# Patient Record
Sex: Female | Born: 1965 | Race: White | Hispanic: No | Marital: Single | Smoking: Former smoker
Health system: Southern US, Community
[De-identification: ages and names within clinical notes are randomized; demographics above are authoritative.]

## PROBLEM LIST (undated history)

## (undated) DIAGNOSIS — E119 Type 2 diabetes mellitus without complications: Secondary | ICD-10-CM

## (undated) DIAGNOSIS — Z794 Long term (current) use of insulin: Secondary | ICD-10-CM

## (undated) DIAGNOSIS — Z1239 Encounter for other screening for malignant neoplasm of breast: Secondary | ICD-10-CM

## (undated) DIAGNOSIS — I10 Essential (primary) hypertension: Secondary | ICD-10-CM

## (undated) DIAGNOSIS — M1990 Unspecified osteoarthritis, unspecified site: Secondary | ICD-10-CM

## (undated) DIAGNOSIS — F32A Depression, unspecified: Secondary | ICD-10-CM

## (undated) DIAGNOSIS — Z6829 Body mass index (BMI) 29.0-29.9, adult: Secondary | ICD-10-CM

## (undated) DIAGNOSIS — S069XAA Unspecified intracranial injury with loss of consciousness status unknown, initial encounter: Secondary | ICD-10-CM

## (undated) DIAGNOSIS — T794XXA Traumatic shock, initial encounter: Secondary | ICD-10-CM

## (undated) DIAGNOSIS — E785 Hyperlipidemia, unspecified: Secondary | ICD-10-CM

## (undated) DIAGNOSIS — D6800 Von Willebrand disease, unspecified: Secondary | ICD-10-CM

## (undated) DIAGNOSIS — D692 Other nonthrombocytopenic purpura: Secondary | ICD-10-CM

## (undated) DIAGNOSIS — K219 Gastro-esophageal reflux disease without esophagitis: Secondary | ICD-10-CM

## (undated) DIAGNOSIS — R0789 Other chest pain: Secondary | ICD-10-CM

## (undated) HISTORY — PX: WISDOM TOOTH EXTRACTION: SHX21

## (undated) HISTORY — DX: Body mass index (BMI) 29.0-29.9, adult: Z68.29

## (undated) HISTORY — PX: TRACHEOSTOMY TUBE PLACEMENT: SHX814

## (undated) HISTORY — DX: Depression, unspecified: F32.A

## (undated) HISTORY — PX: OTHER SURGICAL HISTORY: SHX169

## (undated) HISTORY — DX: Unspecified intracranial injury with loss of consciousness status unknown, initial encounter: S06.9XAA

## (undated) HISTORY — PX: TRACHEOSTOMY CLOSURE: SHX458

## (undated) HISTORY — DX: Other nonthrombocytopenic purpura: D69.2

## (undated) HISTORY — DX: Traumatic shock, initial encounter: T79.4XXA

## (undated) HISTORY — DX: Other chest pain: R07.89

## (undated) HISTORY — DX: Von Willebrand disease, unspecified: D68.00

## (undated) HISTORY — PX: BRAIN SURGERY: SHX531

## (undated) HISTORY — DX: Hyperlipidemia, unspecified: E78.5

## (undated) HISTORY — DX: Essential (primary) hypertension: I10

---

## 2004-08-17 ENCOUNTER — Emergency Department (HOSPITAL_COMMUNITY): Admission: EM | Admit: 2004-08-17 | Discharge: 2004-08-17 | Payer: Self-pay | Admitting: Emergency Medicine

## 2004-12-01 ENCOUNTER — Emergency Department (HOSPITAL_COMMUNITY): Admission: EM | Admit: 2004-12-01 | Discharge: 2004-12-01 | Payer: Self-pay | Admitting: Emergency Medicine

## 2005-01-02 ENCOUNTER — Emergency Department (HOSPITAL_COMMUNITY): Admission: EM | Admit: 2005-01-02 | Discharge: 2005-01-02 | Payer: Self-pay | Admitting: Emergency Medicine

## 2005-01-06 ENCOUNTER — Inpatient Hospital Stay (HOSPITAL_COMMUNITY): Admission: AC | Admit: 2005-01-06 | Discharge: 2005-01-13 | Payer: Self-pay

## 2005-01-06 DIAGNOSIS — S069XAA Unspecified intracranial injury with loss of consciousness status unknown, initial encounter: Secondary | ICD-10-CM

## 2005-01-06 HISTORY — PX: BRAIN SURGERY: SHX531

## 2005-01-06 HISTORY — DX: Unspecified intracranial injury with loss of consciousness status unknown, initial encounter: S06.9XAA

## 2005-01-06 HISTORY — PX: TRACHEOSTOMY TUBE PLACEMENT: SHX814

## 2005-01-13 ENCOUNTER — Ambulatory Visit: Payer: Self-pay | Admitting: Physical Medicine & Rehabilitation

## 2005-01-13 ENCOUNTER — Inpatient Hospital Stay (HOSPITAL_COMMUNITY)
Admission: RE | Admit: 2005-01-13 | Discharge: 2005-02-02 | Payer: Self-pay | Admitting: Physical Medicine & Rehabilitation

## 2005-03-02 ENCOUNTER — Ambulatory Visit: Payer: Self-pay | Admitting: Physical Medicine & Rehabilitation

## 2005-03-02 ENCOUNTER — Encounter
Admission: RE | Admit: 2005-03-02 | Discharge: 2005-05-31 | Payer: Self-pay | Admitting: Physical Medicine & Rehabilitation

## 2005-08-14 ENCOUNTER — Ambulatory Visit: Payer: Self-pay | Admitting: Physical Medicine & Rehabilitation

## 2005-08-14 ENCOUNTER — Encounter
Admission: RE | Admit: 2005-08-14 | Discharge: 2005-11-12 | Payer: Self-pay | Admitting: Physical Medicine & Rehabilitation

## 2005-10-09 ENCOUNTER — Ambulatory Visit: Payer: Self-pay | Admitting: Physical Medicine & Rehabilitation

## 2005-12-04 ENCOUNTER — Ambulatory Visit: Payer: Self-pay | Admitting: Physical Medicine & Rehabilitation

## 2005-12-04 ENCOUNTER — Encounter
Admission: RE | Admit: 2005-12-04 | Discharge: 2006-03-04 | Payer: Self-pay | Admitting: Physical Medicine & Rehabilitation

## 2006-06-24 IMAGING — CR DG CHEST 1V PORT
1 series · 1 of 1 positions shown · non-contrast
Comparison: Chest xray from earlier today

CLINICAL DATA: PICC line placement, multiple trauma
 PORTABLE CHEST ? ONE VIEW:  01/06/05  3378 hours

[view not recorded]
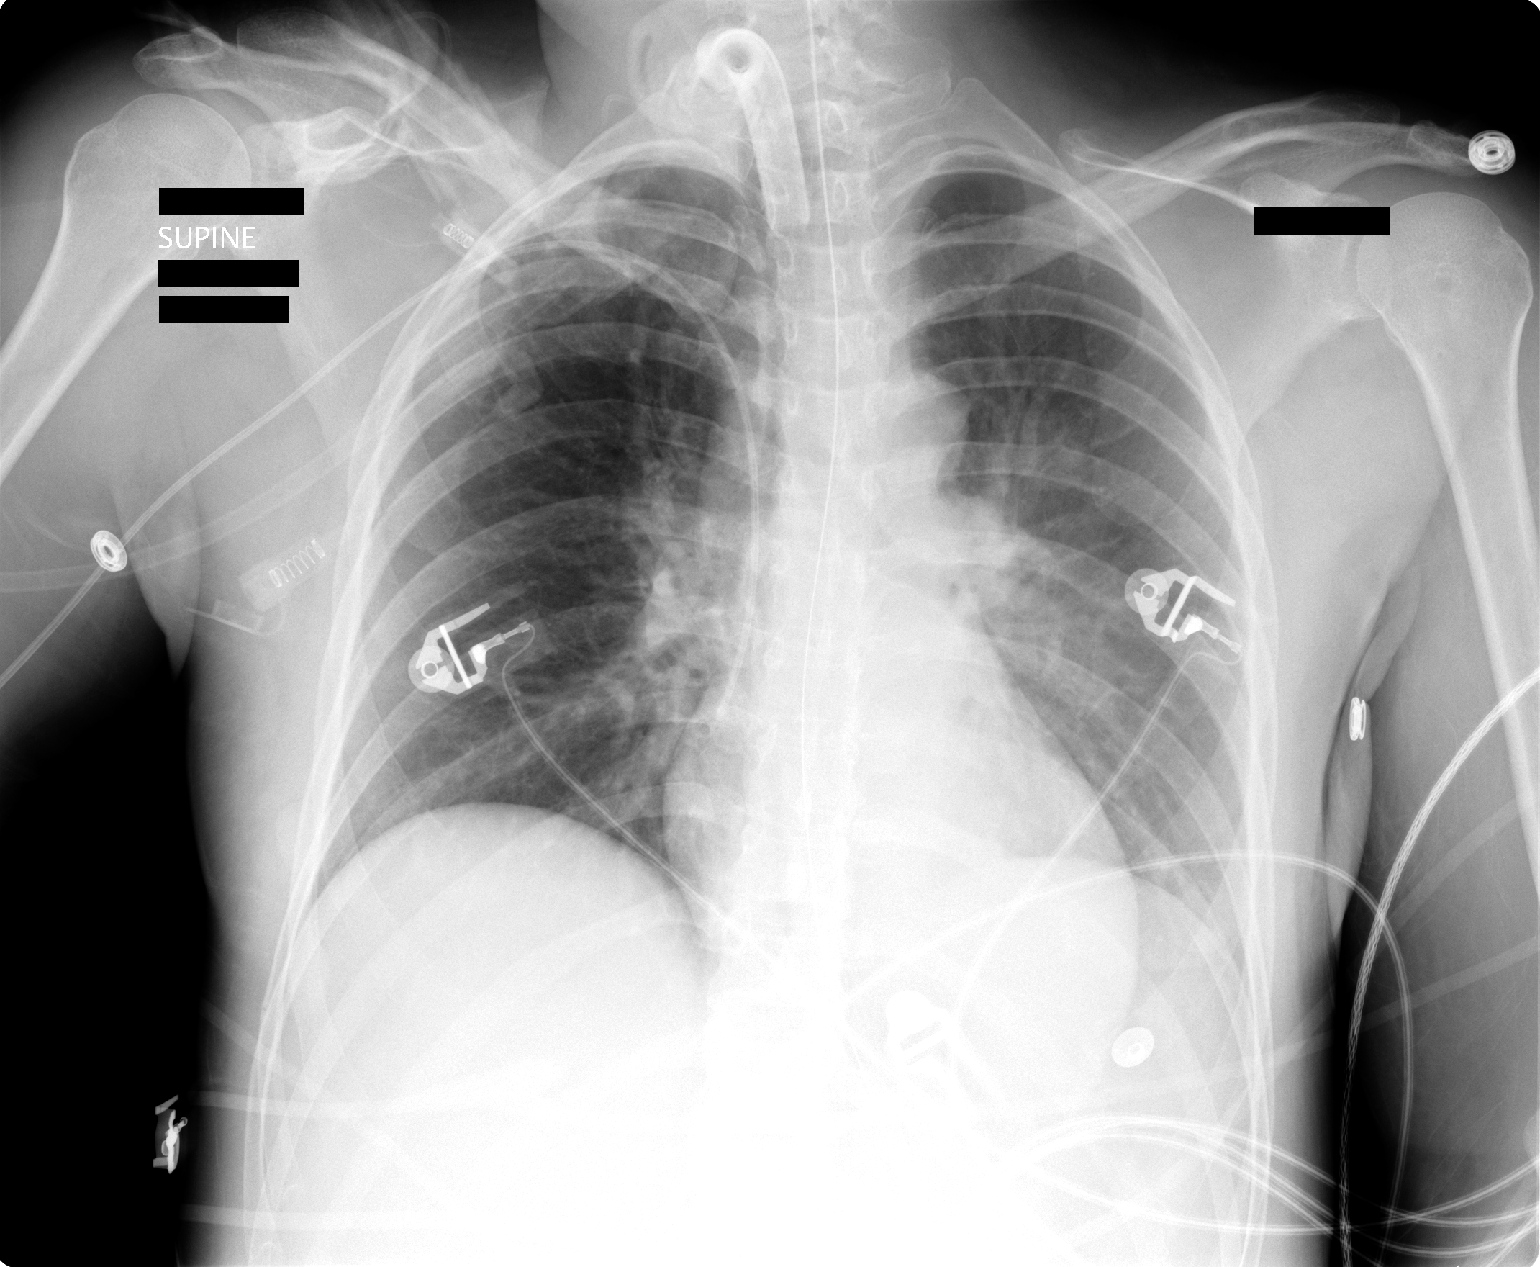

[1 of 1 positions shown; findings below may reference images not displayed]

FINDINGS: A right PICC line has been inserted with the tip in the low SVC.  No pneumothorax is seen.  Tracheostomy and nasogastric tube remain.  Mild left basilar atelectasis is present.
IMPRESSION: Right PICC tip in low SVC.  No pneumothorax.  Mild left basilar atelectasis.

## 2006-06-25 IMAGING — CT CT HEAD W/O CM
1 of 2 series · 13 of 30 positions shown, 17 images · non-contrast
Comparison: 01/06/2005

CLINICAL DATA: Trauma, bleed

HEAD CT WITHOUT CONTRAST
TECHNIQUE: 5mm collimated images were obtained from the base of the skull
through the vertex according to standard protocol without contrast.

[Series 2: brain · axial · 0.47mm/px · z∈[+113,+234]mm · 13 of 28 slices shown, 17 images]
[im 2/28  brain]
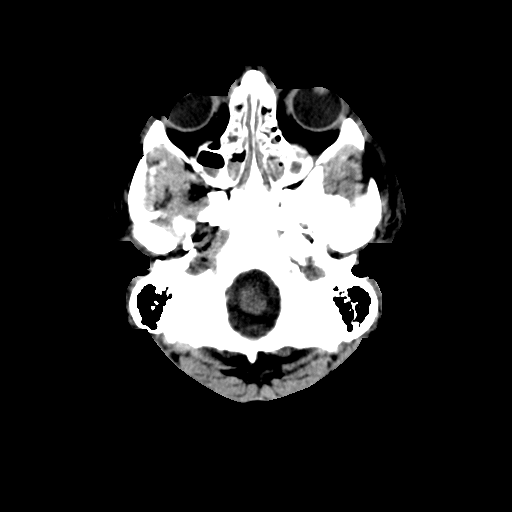
[im 2/28  bone]
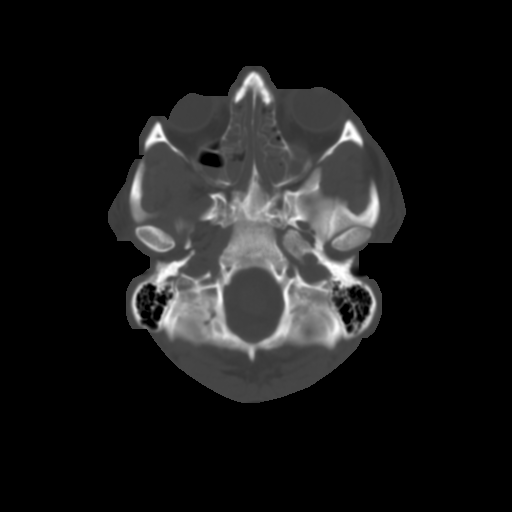
[im 4/28  brain]
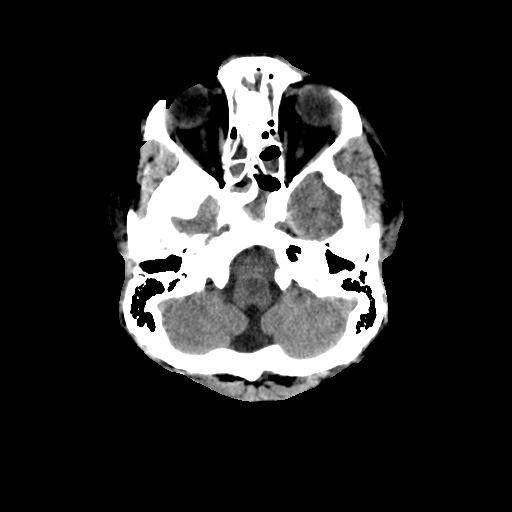
[im 6/28  brain]
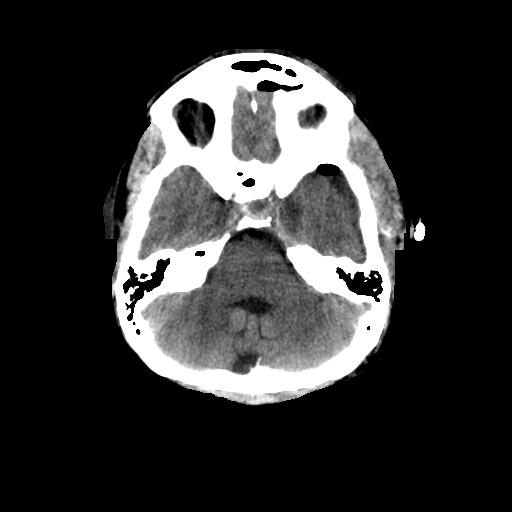
[im 8/28  brain]
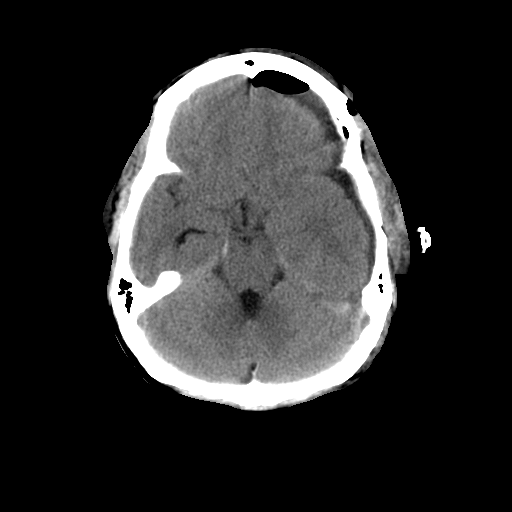
[im 10/28  brain]
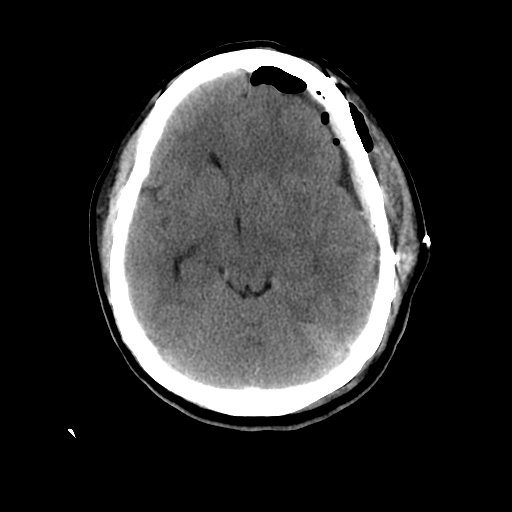
[im 10/28  bone]
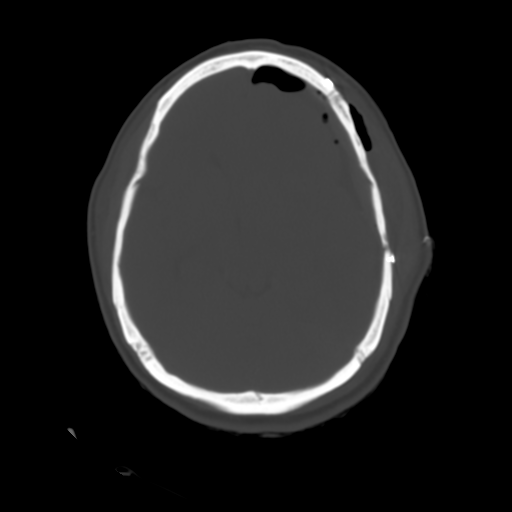
[im 12/28  brain]
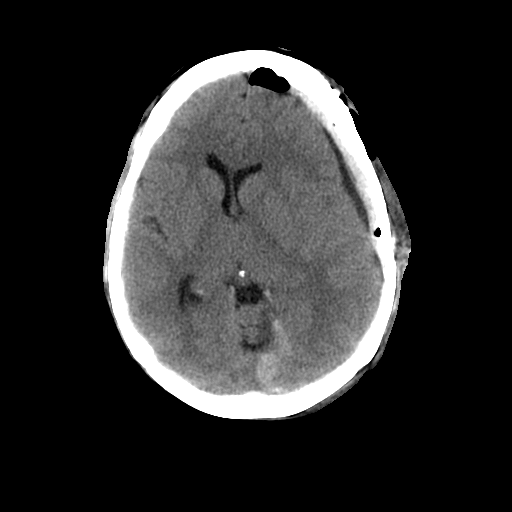
[im 14/28  brain]
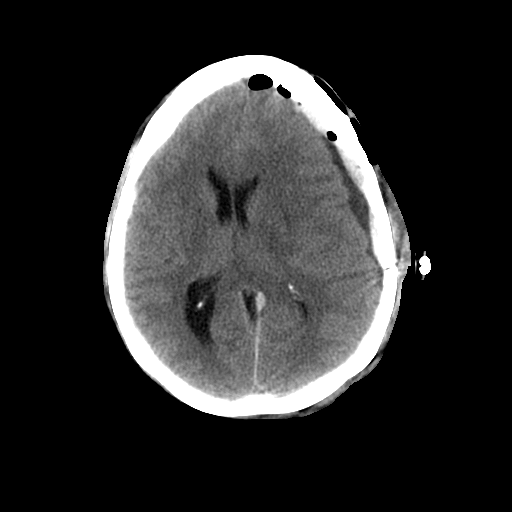
[im 16/28  brain]
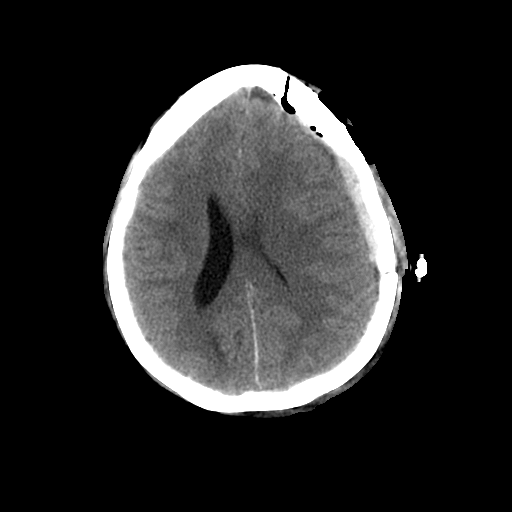
[im 18/28  brain]
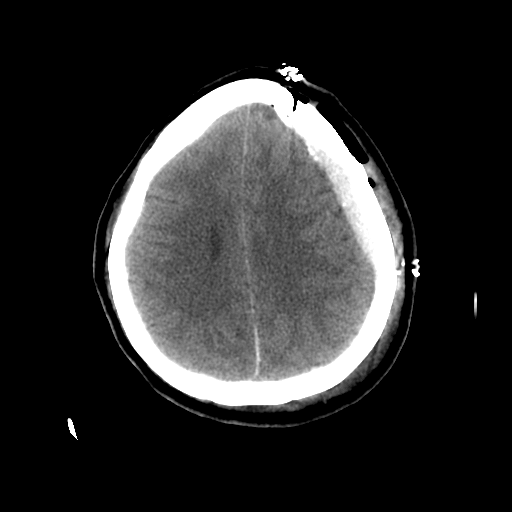
[im 18/28  bone]
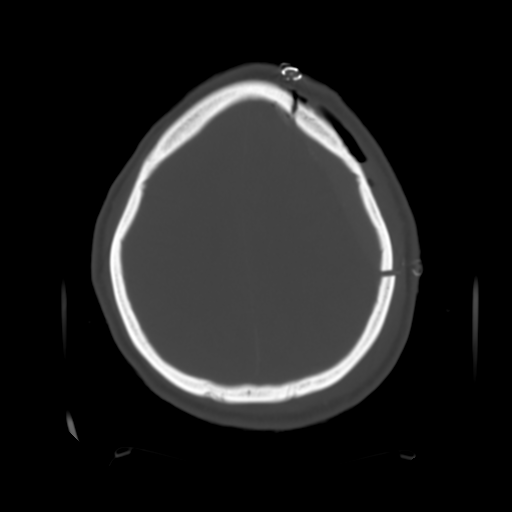
[im 20/28  brain]
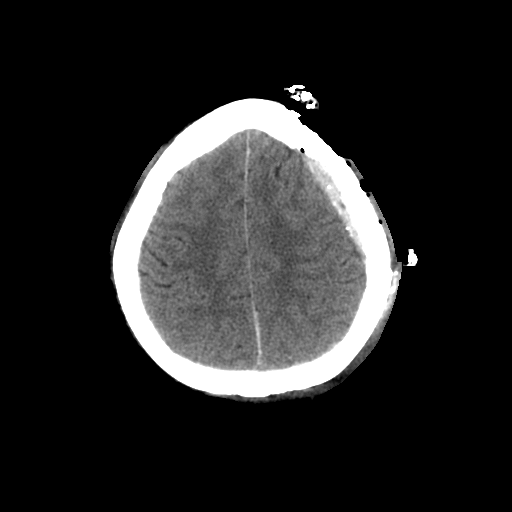
[im 22/28  brain]
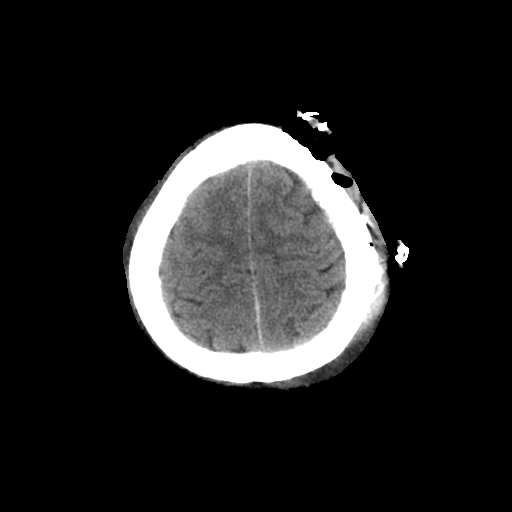
[im 24/28  brain]
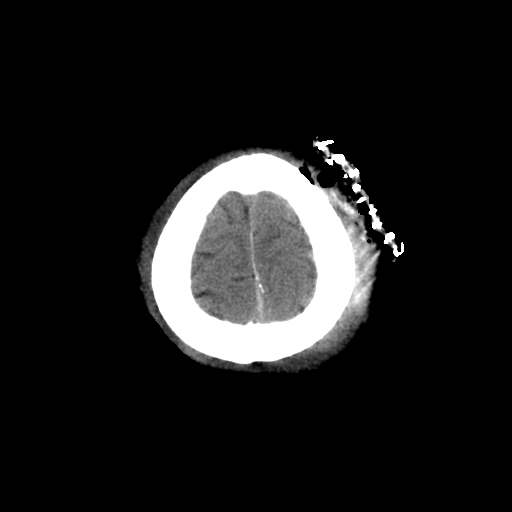
[im 26/28  brain]
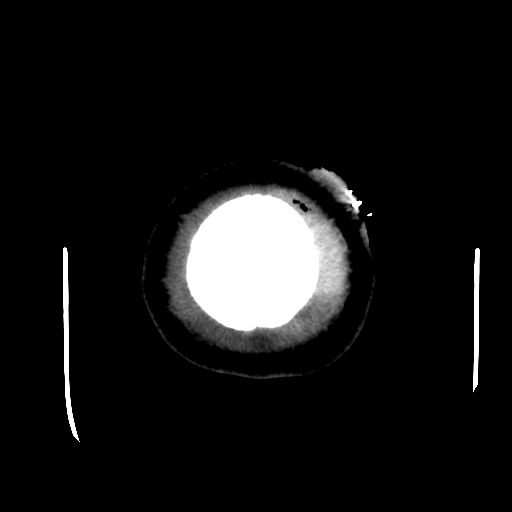
[im 26/28  bone]
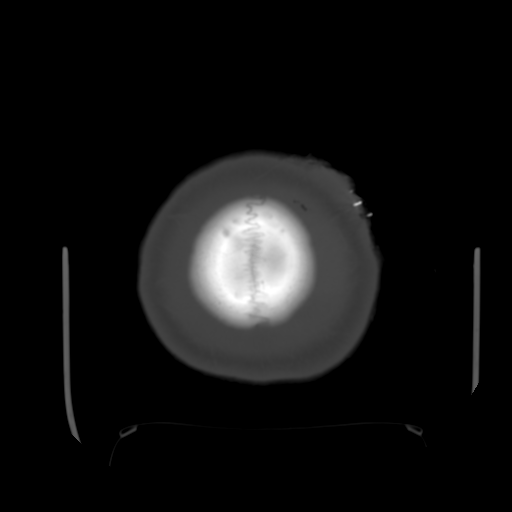

[13 of 30 positions shown; findings below may reference images not displayed]

FINDINGS: There has been interval evacuation of the left subdural hematoma with
left craniotomy. Decreasing subdural fluid, with a thickness now 9 mm, compared
to 16 mm previously. Decreasing left-to-right midline shift, now 8mm, compared
to 14 mm previously. Decrease seen in size of the right lateral ventricle and
effacement of the basilar cisterns. Small amount of blood noted along the
posterior falx and along the left side the tentorium. Extensive sinus air-fluid
levels.

IMPRESSION

Interval left craniotomy with evacuation of left subdural hematoma. Decreasing
left subdural blood, left-to-right midline shift, size of the right lateral
ventricle, and effacement of the basilar cisterns.

## 2006-07-16 IMAGING — CT CT HEAD W/O CM
1 of 2 series · 15 of 30 positions shown, 19 images · non-contrast
Comparison: Unenhanced cranial CT 01/07/2005.

CLINICAL DATA: History of von Willebrand's disease, sustained head trauma with
subdural hematoma on 01/05/2005 and underwent craniotomy for evacuation. Today she
has facial and left-sided numbness and is not responsive to questions.

CRANIAL CT WITHOUT CONTRAST  01/28/2005:
TECHNIQUE: 5mm axial images were obtained from the skull base through the
vertex without intravenous contrast.

[Series 2: brain · axial · 0.47mm/px · z∈[+155,+280]mm · 15 of 28 slices shown, 19 images]
[im 2/28  brain]
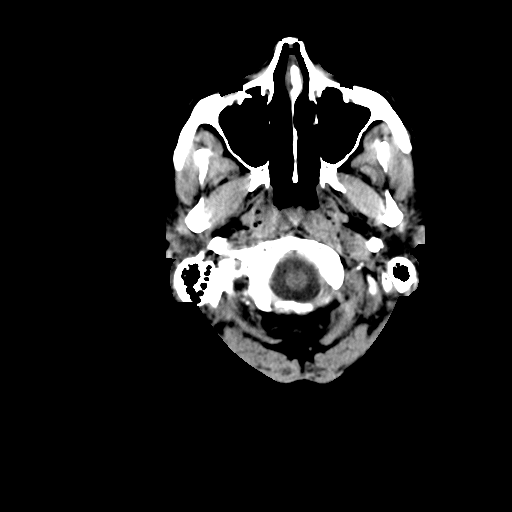
[im 2/28  bone]
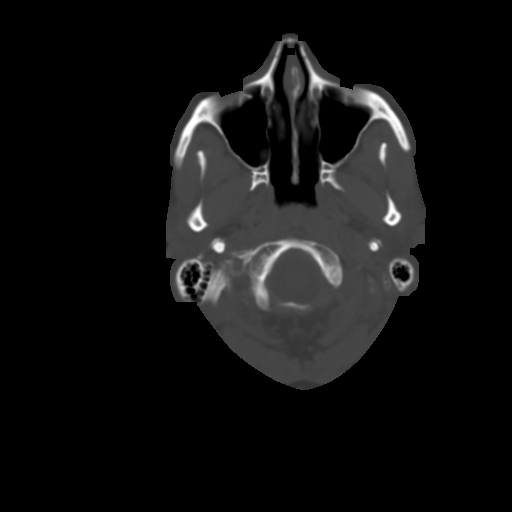
[im 4/28  brain]
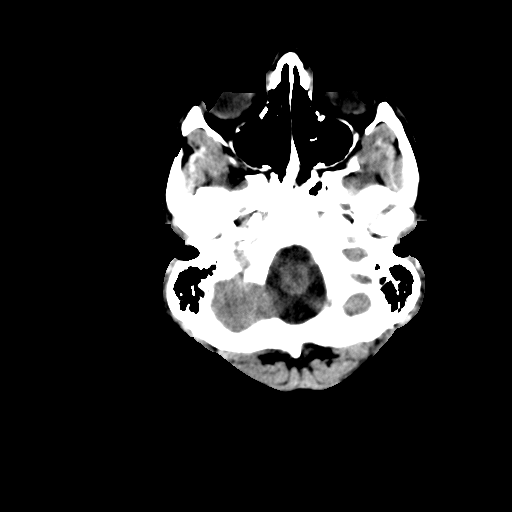
[im 5/28  brain]
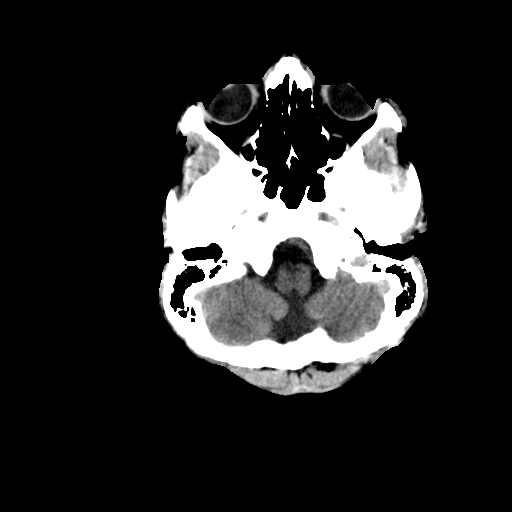
[im 7/28  brain]
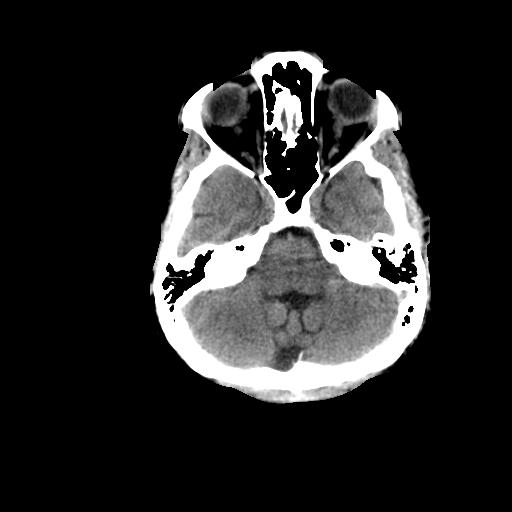
[im 9/28  brain]
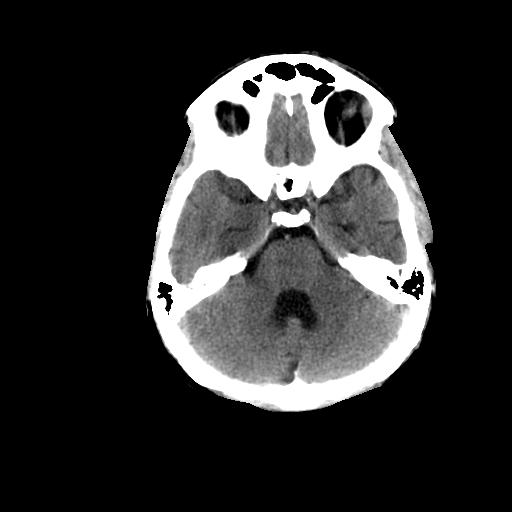
[im 9/28  bone]
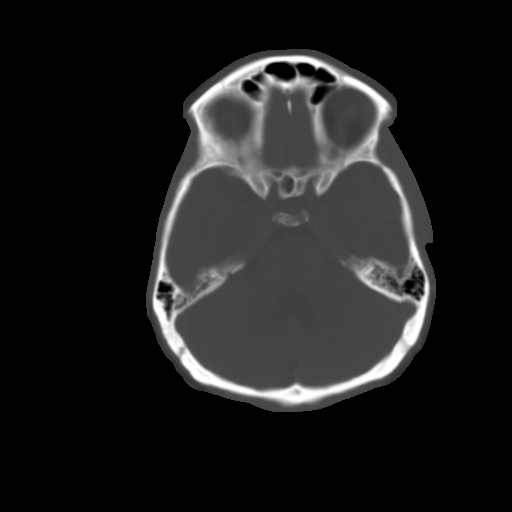
[im 10/28  brain]
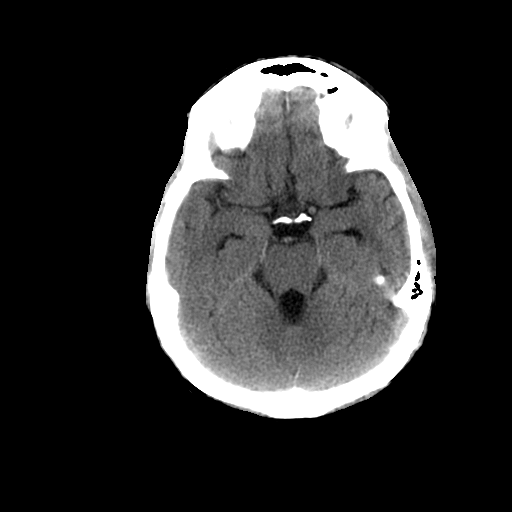
[im 13/28  brain]
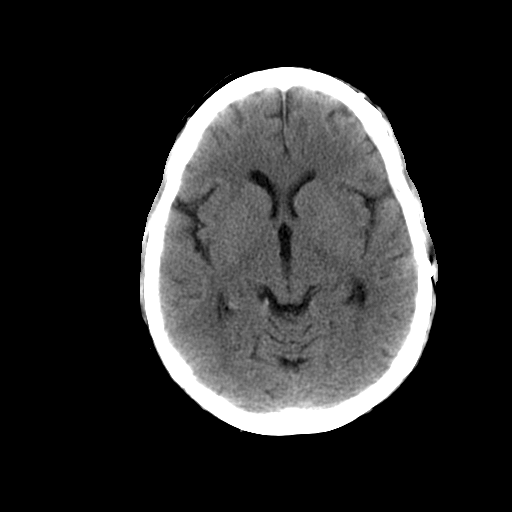
[im 14/28  brain]
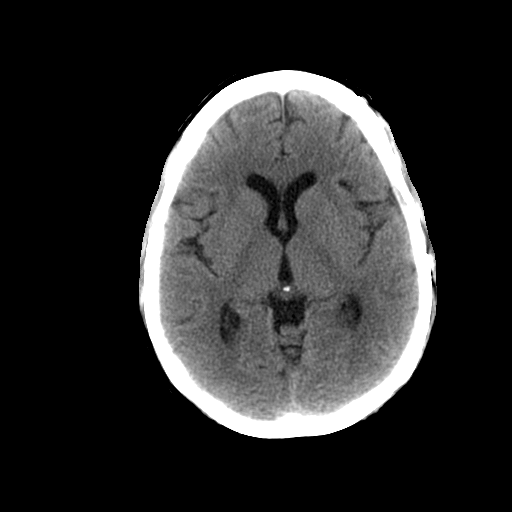
[im 15/28  brain]
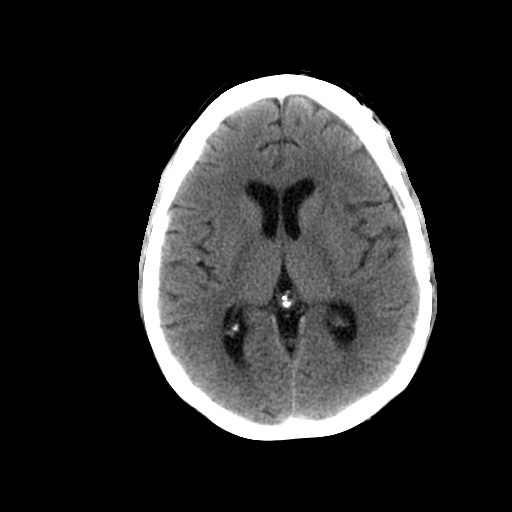
[im 15/28  bone]
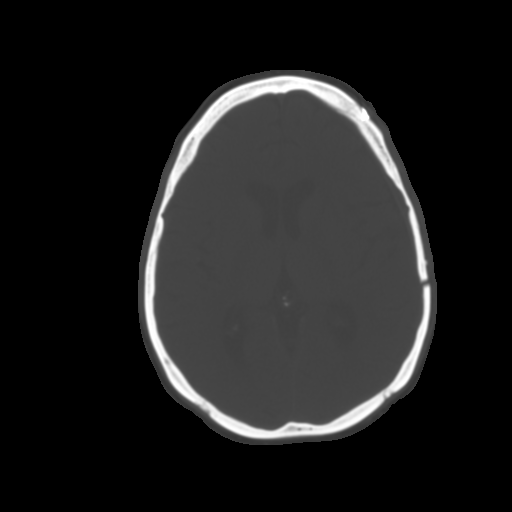
[im 18/28  brain]
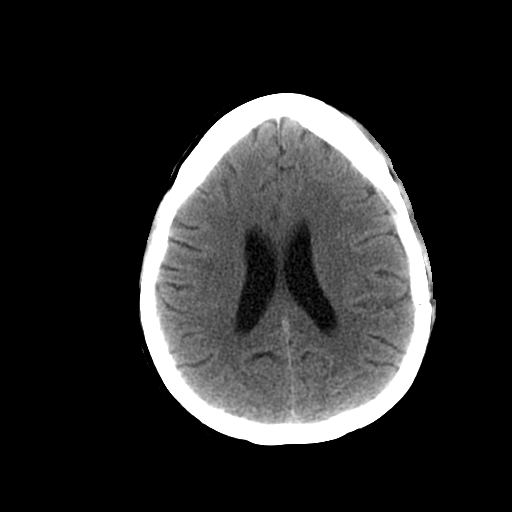
[im 19/28  brain]
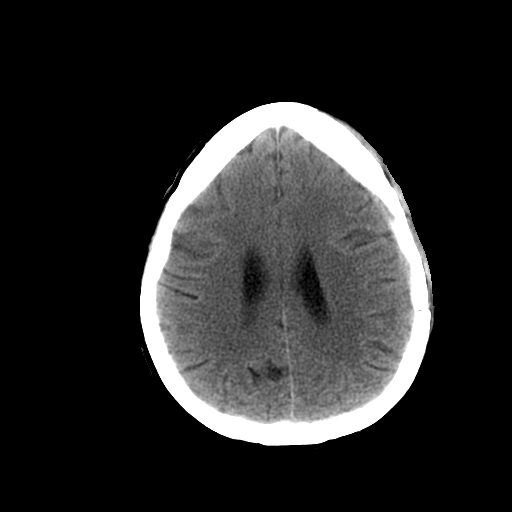
[im 21/28  brain]
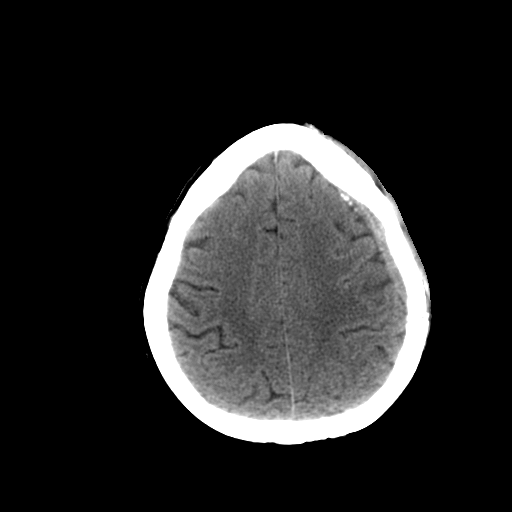
[im 23/28  brain]
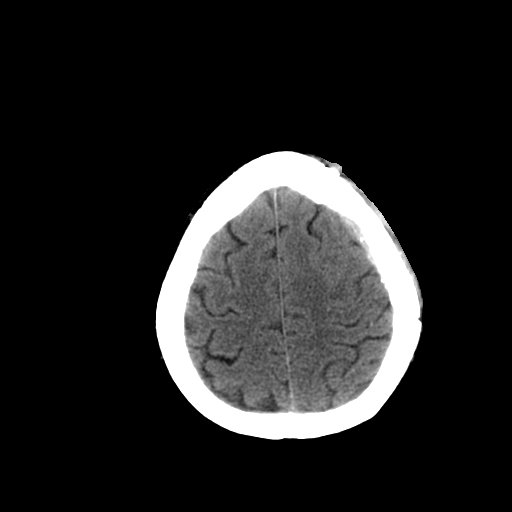
[im 23/28  bone]
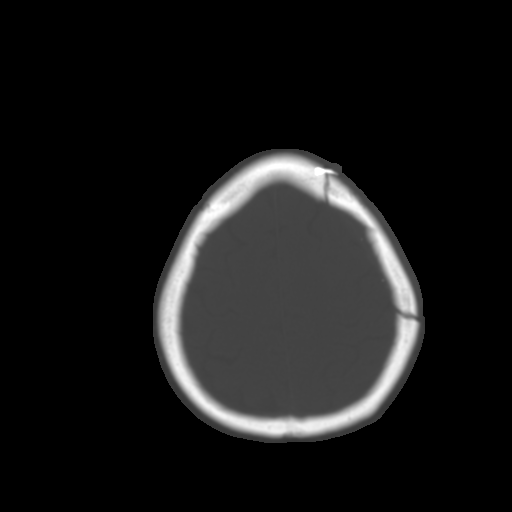
[im 24/28  brain]
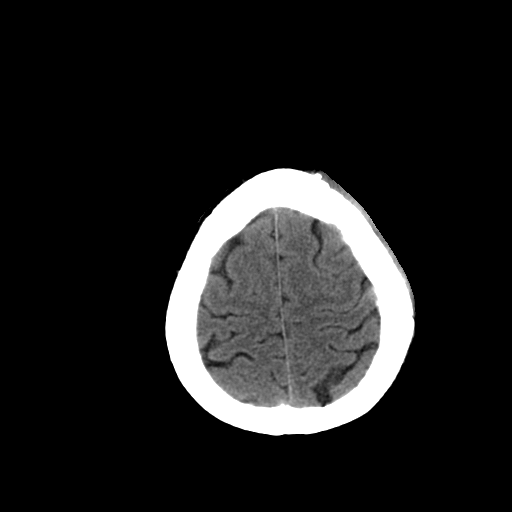
[im 26/28  brain]
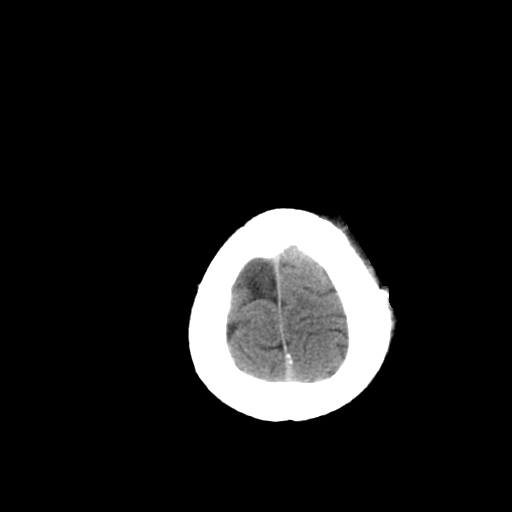

[15 of 30 positions shown; findings below may reference images not displayed]

FINDINGS: There is a residual small left frontoparietal subdural hematoma which
measures approximately 7 mm in thickness. There is no evidence for a new
hemorrhage or hematoma within the brain. Extra-axial collections are not
identified elsewhere. I do not see evidence for an acute stroke at this time.
The ventricular system is normal in size and appearance. There is minimal
residual shift of the midline to the right measuring approximately 2 mm. There
is a subcutaneous/subgaleal hematoma in the left frontal region near the vertex.

The bone window images demonstrate prior left frontoparietal craniotomy. There
is a mucous retention cyst or polyp in the right maxillary antrum. The
visualized paranasal sinuses and mastoid air cells are well-aerated.
IMPRESSION: 1. No acute intracranial abnormality.
2. Residual left frontoparietal subdural hematoma approaching the vertex
measuring approximately 7 mm in thickness. This has improved since the prior
examination 01/07/2005.
2. Residual approximately 2 mm midline shift to the right, much improved from
previous examination where it measured 8 mm.
4. Chronic right maxillary sinusitis.

## 2014-08-05 DIAGNOSIS — R2689 Other abnormalities of gait and mobility: Secondary | ICD-10-CM

## 2014-08-05 HISTORY — DX: Other abnormalities of gait and mobility: R26.89

## 2014-08-06 DIAGNOSIS — F419 Anxiety disorder, unspecified: Secondary | ICD-10-CM | POA: Insufficient documentation

## 2014-08-06 DIAGNOSIS — G43009 Migraine without aura, not intractable, without status migrainosus: Secondary | ICD-10-CM | POA: Insufficient documentation

## 2014-08-06 DIAGNOSIS — D6800 Von Willebrand disease, unspecified: Secondary | ICD-10-CM | POA: Insufficient documentation

## 2014-08-06 DIAGNOSIS — Z8679 Personal history of other diseases of the circulatory system: Secondary | ICD-10-CM

## 2014-08-06 DIAGNOSIS — R519 Headache, unspecified: Secondary | ICD-10-CM | POA: Insufficient documentation

## 2014-08-06 HISTORY — DX: Headache, unspecified: R51.9

## 2014-08-06 HISTORY — DX: Personal history of other diseases of the circulatory system: Z86.79

## 2014-08-06 HISTORY — DX: Anxiety disorder, unspecified: F41.9

## 2014-08-06 HISTORY — DX: Migraine without aura, not intractable, without status migrainosus: G43.009

## 2015-10-05 DIAGNOSIS — R42 Dizziness and giddiness: Secondary | ICD-10-CM

## 2015-10-05 HISTORY — DX: Dizziness and giddiness: R42

## 2016-03-25 DIAGNOSIS — J209 Acute bronchitis, unspecified: Secondary | ICD-10-CM

## 2016-03-25 HISTORY — DX: Acute bronchitis, unspecified: J20.9

## 2016-10-18 ENCOUNTER — Encounter

## 2016-11-15 DIAGNOSIS — Z720 Tobacco use: Secondary | ICD-10-CM

## 2016-11-15 DIAGNOSIS — F17210 Nicotine dependence, cigarettes, uncomplicated: Secondary | ICD-10-CM | POA: Insufficient documentation

## 2016-11-15 DIAGNOSIS — G8929 Other chronic pain: Secondary | ICD-10-CM | POA: Insufficient documentation

## 2016-11-15 HISTORY — DX: Nicotine dependence, cigarettes, uncomplicated: F17.210

## 2016-11-15 HISTORY — DX: Tobacco use: Z72.0

## 2016-11-15 HISTORY — DX: Other chronic pain: G89.29

## 2016-12-28 ENCOUNTER — Encounter

## 2016-12-28 ENCOUNTER — Inpatient Hospital Stay: Admit: 2016-12-28 | Payer: PRIVATE HEALTH INSURANCE | Attending: Specialist | Primary: Family Medicine

## 2016-12-28 DIAGNOSIS — Z1231 Encounter for screening mammogram for malignant neoplasm of breast: Secondary | ICD-10-CM

## 2018-05-28 LAB — AMB EXT HGBA1C
Hemoglobin A1C, External: 7.1 NA
Hemoglobin A1c, External: 7.1

## 2018-07-09 ENCOUNTER — Ambulatory Visit
Admit: 2018-07-09 | Discharge: 2018-07-09 | Payer: PRIVATE HEALTH INSURANCE | Attending: "Endocrinology | Primary: Family Medicine

## 2018-07-09 ENCOUNTER — Ambulatory Visit: Attending: "Endocrinology | Primary: Family Medicine

## 2018-07-09 DIAGNOSIS — E119 Type 2 diabetes mellitus without complications: Secondary | ICD-10-CM

## 2018-07-09 MED ORDER — FLASH GLUCOSE SCANNING READER
3 refills | Status: DC
Start: 2018-07-09 — End: 2018-07-16

## 2018-07-09 MED ORDER — INSULIN DEGLUDEC 100 UNIT/ML (3 ML) SUB-Q PEN
100 unit/mL (3 mL) | SUBCUTANEOUS | 3 refills | Status: DC
Start: 2018-07-09 — End: 2018-07-16

## 2018-07-09 MED ORDER — FLASH GLUCOSE SENSOR KIT
PACK | 3 refills | Status: DC
Start: 2018-07-09 — End: 2018-07-16

## 2018-07-09 NOTE — Telephone Encounter (Signed)
U-100

## 2018-07-09 NOTE — Telephone Encounter (Signed)
Yes please

## 2018-07-09 NOTE — Progress Notes (Signed)
Endocrinology Consultation    Date of Visit: 07/12/18    Patient:  Name: Victoria Sweeney            MRN: ??[id number]            DOB: [birth year]    Victoria Sweeney was seen in consultation in the Endocrine clinic at the request of  Wilhemena Durie, FNP for: evaluation of Type 2 DM. 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     HISTORY OF PRESENT ILLNESS:  Victoria Sweeney is a very pleasant 53 y.o. yo female who presents for evaluation of type 2 Diabetes.    Diagnosed with diabetes in 2014.Started on insulin around 2018. Initially lost 40 lbs with diligent effort, but has put on 10 lbs since then.     Constitutional:             Weight stable                                      Denies polydipsia  Eyes:                           Denies blurred vision    Current Diabetic regimen includes:    Metformin 1000 mg twice dialy  Amaryl 4 mg daily  Now on Lantus 20 units daily  Had nocturnal hypoglycemia (45 mg/dL) so PM dose removed and AM dose reduced  Sweating spells in the AM, but gets chills in the PM                 Diet:   BF: English muffin with PB and an orange or plum or small fruit, coffee  LN: flatbread with deli meat and cheese with an apple or yogurt  DN: frozen [employer] dinner, or serving of chicken with rice and veggies  SN: tries not to snack at night, no daytime snack  DS: none  Largest Meal of the Day is:  Holiday representative  Fluid: coffee, 3 glasses of water, diet zero coke at night, and a cup of tea    SMBG  Frequency: checks 1-2  times a day  Fingerstick range:   Pre Breakfast: 170-180 mg/dL (used to be 120)  Bedtime: 250 mg/dL (was 180 mg/dL)    Hypoglycemia:   Lowest Blood sugar: 42 mg/dL      Range: 42-70   Symptoms: shaky, sweaty  Gets Symptoms at: 70  Frequency: 3 times per week  Treats with: meal if near mealtime, peanut butter or simple candy  Within the last six weeks, has had pre-lunch hypoglycemia symptoms.     Previous DM education:       Is interested/ not interested in further DM Education.    Exercise:  ADLs, is joining the gym tomorrow, plans to go four days a week and walk on the treadmill    ROUTINE CARE:  Eye checks:    Yes/ - Last July 2019, on [place] checks:   Yes/ - Nightly, had them checked 6 weeks ago, no signs of neuropathy   Flu Vaccine:    Yes/   PNA vaccine:  Yes/   Aspirin:            No  ACEI/ARB:      No  Statin:  Yes     COMPLICATIONS:  Cardio-vascular -Denies   Retinopathy     - Denies  Kidneys            -Denies   Feet                 - Denies                              REVIEW OF SYSTEMS:  as per HPI, all other systems reviewed and negative    Patient Active Problem List   Diagnosis Code   ??? Anxiety F41.9   ??? Gastroesophageal reflux disease K21.9   ??? Hypothyroidism E03.9   ??? Metatarsalgia M77.40   ??? Migraine without aura G43.009   ??? Rotator cuff syndrome M75.100   ??? Type 2 diabetes mellitus without complication (HCC) Z61.0     Allergies   Allergen Reactions   ??? Other Food Unknown (comments)     DHE-45   ??? Codeine Shortness of Breath     Hives, tachycardia   ??? Contrast Agent [Iodine] Shortness of Breath     Sob, nv, diarrhea,hypotension   ??? Morphine Other (comments)     Hypotension, sob   ??? Amoxicillin-Pot Clavulanate Nausea and Vomiting     diarrhea   ??? Ceftin [Cefuroxime Axetil] Unknown (comments)   ??? Chocolate Flavor Unknown (comments)   ??? Ergotamine Nausea and Vomiting   ??? Erythromycin Nausea and Vomiting   ??? Hydrocodone-Ibuprofen Unknown (comments)   ??? Imitrex [Sumatriptan Succinate] Other (comments)     Tachycardia, rash sob swelling   ??? Lipitor [Atorvastatin] Unknown (comments)   ??? Milk Containing Products Unknown (comments)   ??? Tomato Other (comments)     headache   ??? Tramadol Hives   ??? Vicodin [Hydrocodone-Acetaminophen] Nausea and Vomiting     diarrhea     Current Outpatient Medications on File Prior to Visit   Medication Sig Dispense Refill    ??? Lancing Device with Lancets (ACCU-CHEK FASTCLIX LANCING DEV) kit Use twice daily     ??? glucose blood VI test strips (ACCU-CHEK SMARTVIEW TEST STRIP) strip Take 2 strips every day by miscell. route as needed.     ??? Insulin Needles, Disposable, (HEALTHY ACCENTS UNIFINE PENTIP) 31 gauge x 5/16" ndle Healthy Accents Unifine Pentip 31 gauge x 5/16" needle     ??? magnesium 250 mg tab magnesium     ??? diazePAM (VALIUM) 10 mg tablet diazepam 10 mg tablet     ??? FLUoxetine (PROZAC) 20 mg capsule      ??? ONETOUCH DELICA PLUS LANCET 33 gauge misc      ??? levothyroxine (SYNTHROID) 100 mcg tablet Patient taking 166mg     ??? metFORMIN (GLUCOPHAGE) 1,000 mg tablet      ??? pantoprazole (PROTONIX) 40 mg tablet TAKE ONE TABLET BY MOUTH EVERY MORNING     ??? prochlorperazine (COMPAZINE) 10 mg tablet Take 1 tablet 3 times a day by oral route as needed for 10 days.     ??? propranolol LA (INDERAL LA) 120 mg SR capsule      ??? rosuvastatin (CRESTOR) 20 mg tablet      ??? cholecalciferol (VITAMIN D3) (1000 Units /25 mcg) tablet Take  by mouth daily.       No current facility-administered medications on file prior to visit.      Current Outpatient Medications   Medication Sig Dispense Refill   ??? Lancing Device with  Lancets (ACCU-CHEK FASTCLIX LANCING DEV) kit Use twice daily     ??? glucose blood VI test strips (ACCU-CHEK SMARTVIEW TEST STRIP) strip Take 2 strips every day by miscell. route as needed.     ??? Insulin Needles, Disposable, (HEALTHY ACCENTS UNIFINE PENTIP) 31 gauge x 5/16" ndle Healthy Accents Unifine Pentip 31 gauge x 5/16" needle     ??? magnesium 250 mg tab magnesium     ??? diazePAM (VALIUM) 10 mg tablet diazepam 10 mg tablet     ??? FLUoxetine (PROZAC) 20 mg capsule      ??? ONETOUCH DELICA PLUS LANCET 33 gauge misc      ??? levothyroxine (SYNTHROID) 100 mcg tablet Patient taking 134mg     ??? metFORMIN (GLUCOPHAGE) 1,000 mg tablet      ??? pantoprazole (PROTONIX) 40 mg tablet TAKE ONE TABLET BY MOUTH EVERY MORNING      ??? prochlorperazine (COMPAZINE) 10 mg tablet Take 1 tablet 3 times a day by oral route as needed for 10 days.     ??? propranolol LA (INDERAL LA) 120 mg SR capsule      ??? rosuvastatin (CRESTOR) 20 mg tablet      ??? cholecalciferol (VITAMIN D3) (1000 Units /25 mcg) tablet Take  by mouth daily.     ??? insulin degludec (TRESIBA FLEXTOUCH U-100) 100 unit/mL (3 mL) inpn Inject 20-30 units once daily sub Q 30 mL 3   ??? flash glucose scanning reader (FREESTYLE LIBRE 14 DAY READER) misc For use with [employer]1 Each 3   ??? flash glucose sensor (FREESTYLE LIBRE 14 DAY SENSOR) kit CGM Change sensor every 14 days 6 Kit 3     Social History     Socioeconomic History   ??? Marital status: SINGLE     Spouse name: Not on file   ??? Number of children: Not on file   ??? Years of education: Not on file   ??? Highest education level: Not on file   Tobacco Use   ??? Smoking status: Never Smoker   ??? Smokeless tobacco: Never Used     History reviewed. No pertinent family history.      PHYSICAL EXAM:  Vitals:    07/09/18 0941   BP: 95/71   Pulse: (!) 57   Weight: 200 lb (90.7 kg)   Height: '5\' 5"'$  (1.651 m)     Body mass index is 33.28 kg/m??.    General:   Pleasant female in no apparent distress  Eyes: Conjunctiva / sclera normal  Neck: Supple, no adenopathy; thyroid symmetric  CVS: S1 S2 heard, regular HS  RS: Clear to Auscultation Bilaterally  ABD: ND, NT, BS +,   Extrem:    No edema  Dorsalis pedis palpable  Intact sensation on 10 G monofilament testing on the dorsum of the feet       LABS-  A1c 7.1% 06/10/18  Cholesterol 149  TG 82  HDL 54  LDL 70    06/22/2018  Calcium 9.6  Creatinine 0.83  GFR >60  LFTs:  Normal    Microalbumin/creatinine:  Less than 7.2    ASSESSMENT AND PLAN:    Diagnoses and all orders for this visit:    1. Type 2 diabetes mellitus without complication, with long-term current use of insulin ([facility]     Patient is a 53year old female being seen for evaluation of type 2  diabetes.  Patient was seen in conjunction with KJeani SowRD.   I have reviewed her history  above and agree with it.  Patient is currently on metformin, Amaryl and Lantus.  She would like to lose some weight.  She is also having low blood sugars overnight.  Does not have any complications of diabetes.  Recent A1c was  7.4%.  Plan  Patient will stop the Lantus and switched to tresiba.  There is decreased incidence of overnight low blood sugars with tresiba.  Patient should continue with the metformin.  At some point, I would also like to stop the Amaryl and if possible switch the patient to Januvia or Victoza.  Patient does not want to do all the changes at once.  She is agreeable to switching to tresiba at present.  Script was sent to start with 20 units daily.  Patient will titrate it by 2 units every 3 days until she reaches her goal fasting blood sugar of 130 or less.  Patient is also interested in [place] continous glucose monitoring sensor.  Script was sent in today.  Will consider switching the patient from 2 Januvia or Victoza at the next visit.  Follow-up in 6 weeks with Jeani Sow RD and 3 months with me.     Josetta Huddle, MD, CDE  07/09/2018    Copy of this note will be faxed to the referring provider.

## 2018-07-09 NOTE — Patient Instructions (Addendum)
Pair protein with carbohydrate, the amounts do not have to be equal, but aim for 20-30 grams protein at mealtimes and 30-45 grams carbohydrate at meals.     Even if your insurance doesn't qualify you for coverage, patients can pay out of pocket for a Libre CGM, It's about $75/month        PLAN  Will switch to tresiba from lantus.  Check fasting blood sugar. If more than 130 for 3 days in a row, increase the tresiba  insulin by 2 units. Keep increasing until you reach goal.If fasting under 70, decrease the insulin by 5 units.

## 2018-07-09 NOTE — Telephone Encounter (Signed)
Done.  Do you want the Lantus removed from the med list?

## 2018-07-09 NOTE — Telephone Encounter (Signed)
Done.

## 2018-07-09 NOTE — Telephone Encounter (Signed)
Tresiba U-100 or U-200?

## 2018-07-09 NOTE — Telephone Encounter (Addendum)
Pls send a script for tresiba 20-30 units daily and CGM [place] sensor.

## 2018-07-09 NOTE — Progress Notes (Signed)
Endocrinology Consultation    Date of Visit: 07/12/18    Patient:  Name: Victoria Sweeney            MRN: ??[id number]            DOB: June 21, 1966    Victoria Sweeney was seen in consultation in the Endocrine clinic at the request of  Wilhemena Durie, FNP for: evaluation of Type 2 DM. 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     HISTORY OF PRESENT ILLNESS:  Victoria Sweeney is a very pleasant 53 y.o. yo female who presents for evaluation of type 2 Diabetes.    Diagnosed with diabetes in 2014.Started on insulin around 2018. Initially lost 40 lbs with diligent effort, but has put on 10 lbs since then.     Constitutional:             Weight stable                                      Denies polydipsia  Eyes:                           Denies blurred vision    Current Diabetic regimen includes:    Metformin 1000 mg twice dialy  Amaryl 4 mg daily  Now on Lantus 20 units daily  Had nocturnal hypoglycemia (45 mg/dL) so PM dose removed and AM dose reduced  Sweating spells in the AM, but gets chills in the PM                 Diet:   BF: English muffin with PB and an orange or plum or small fruit, coffee  LN: flatbread with deli meat and cheese with an apple or yogurt  DN: frozen [employer] dinner, or serving of chicken with rice and veggies  SN: tries not to snack at night, no daytime snack  DS: none  Largest Meal of the Day is:  Holiday representative  Fluid: coffee, 3 glasses of water, diet zero coke at night, and a cup of tea    SMBG  Frequency: checks 1-2  times a day  Fingerstick range:   Pre Breakfast: 170-180 mg/dL (used to be 120)  Bedtime: 250 mg/dL (was 180 mg/dL)    Hypoglycemia:   Lowest Blood sugar: 42 mg/dL      Range: 42-70   Symptoms: shaky, sweaty  Gets Symptoms at: 70  Frequency: 3 times per week  Treats with: meal if near mealtime, peanut butter or simple candy  Within the last six weeks, has had pre-lunch hypoglycemia symptoms.     Previous DM education:      Is interested/ not interested in further DM  Education.    Exercise:  ADLs, is joining the gym tomorrow, plans to go four days a week and walk on the treadmill    ROUTINE CARE:  Eye checks:    Yes/ - Last July 2019, on [place] checks:   Yes/ - Nightly, had them checked 6 weeks ago, no signs of neuropathy   Flu Vaccine:    Yes/   PNA vaccine:  Yes/   Aspirin:            No  ACEI/ARB:      No  Statin:  Yes     COMPLICATIONS:  Cardio-vascular -Denies   Retinopathy     - Denies  Kidneys            -Denies   Feet                 - Denies                              REVIEW OF SYSTEMS:  as per HPI, all other systems reviewed and negative    Patient Active Problem List   Diagnosis Code   ??? Anxiety F41.9   ??? Gastroesophageal reflux disease K21.9   ??? Hypothyroidism E03.9   ??? Metatarsalgia M77.40   ??? Migraine without aura G43.009   ??? Rotator cuff syndrome M75.100   ??? Type 2 diabetes mellitus without complication (HCC) I69.6     Allergies   Allergen Reactions   ??? Other Food Unknown (comments)     DHE-45   ??? Codeine Shortness of Breath     Hives, tachycardia   ??? Contrast Agent [Iodine] Shortness of Breath     Sob, nv, diarrhea,hypotension   ??? Morphine Other (comments)     Hypotension, sob   ??? Amoxicillin-Pot Clavulanate Nausea and Vomiting     diarrhea   ??? Ceftin [Cefuroxime Axetil] Unknown (comments)   ??? Chocolate Flavor Unknown (comments)   ??? Ergotamine Nausea and Vomiting   ??? Erythromycin Nausea and Vomiting   ??? Hydrocodone-Ibuprofen Unknown (comments)   ??? Imitrex [Sumatriptan Succinate] Other (comments)     Tachycardia, rash sob swelling   ??? Lipitor [Atorvastatin] Unknown (comments)   ??? Milk Containing Products Unknown (comments)   ??? Tomato Other (comments)     headache   ??? Tramadol Hives   ??? Vicodin [Hydrocodone-Acetaminophen] Nausea and Vomiting     diarrhea     Current Outpatient Medications on File Prior to Visit   Medication Sig Dispense Refill   ??? Lancing Device with Lancets (ACCU-CHEK FASTCLIX LANCING DEV) kit Use twice daily      ??? glucose blood VI test strips (ACCU-CHEK SMARTVIEW TEST STRIP) strip Take 2 strips every day by miscell. route as needed.     ??? Insulin Needles, Disposable, (HEALTHY ACCENTS UNIFINE PENTIP) 31 gauge x 5/16" ndle Healthy [place]ents Unifine Pentip 31 gauge x 5/16" needle     ??? magnesium 250 mg tab magnesium     ??? diazePAM (VALIUM) 10 mg tablet diazepam 10 mg tablet     ??? FLUoxetine (PROZAC) 20 mg capsule      ??? ONETOUCH DELICA PLUS LANCET 33 gauge misc      ??? levothyroxine (SYNTHROID) 100 mcg tablet Patient taking 130mg     ??? metFORMIN (GLUCOPHAGE) 1,000 mg tablet      ??? pantoprazole (PROTONIX) 40 mg tablet TAKE ONE TABLET BY MOUTH EVERY MORNING     ??? prochlorperazine (COMPAZINE) 10 mg tablet Take 1 tablet 3 times a day by oral route as needed for 10 days.     ??? propranolol LA (INDERAL LA) 120 mg SR capsule      ??? rosuvastatin (CRESTOR) 20 mg tablet      ??? cholecalciferol (VITAMIN D3) (1000 Units /25 mcg) tablet Take  by mouth daily.       No current facility-administered medications on file prior to visit.      Current Outpatient Medications   Medication Sig Dispense Refill   ??? Lancing Device with  Lancets (ACCU-CHEK FASTCLIX LANCING DEV) kit Use twice daily     ??? glucose blood VI test strips (ACCU-CHEK SMARTVIEW TEST STRIP) strip Take 2 strips every day by miscell. route as needed.     ??? Insulin Needles, Disposable, (HEALTHY ACCENTS UNIFINE PENTIP) 31 gauge x 5/16" ndle Healthy [place]ents Unifine Pentip 31 gauge x 5/16" needle     ??? magnesium 250 mg tab magnesium     ??? diazePAM (VALIUM) 10 mg tablet diazepam 10 mg tablet     ??? FLUoxetine (PROZAC) 20 mg capsule      ??? ONETOUCH DELICA PLUS LANCET 33 gauge misc      ??? levothyroxine (SYNTHROID) 100 mcg tablet Patient taking 162mg     ??? metFORMIN (GLUCOPHAGE) 1,000 mg tablet      ??? pantoprazole (PROTONIX) 40 mg tablet TAKE ONE TABLET BY MOUTH EVERY MORNING     ??? prochlorperazine (COMPAZINE) 10 mg tablet Take 1 tablet 3 times a day by oral route as needed for 10 days.      ??? propranolol LA (INDERAL LA) 120 mg SR capsule      ??? rosuvastatin (CRESTOR) 20 mg tablet      ??? cholecalciferol (VITAMIN D3) (1000 Units /25 mcg) tablet Take  by mouth daily.     ??? insulin degludec (TRESIBA FLEXTOUCH U-100) 100 unit/mL (3 mL) inpn Inject 20-30 units once daily sub Q 30 mL 3   ??? flash glucose scanning reader (FREESTYLE LIBRE 14 DAY READER) misc For use with [employer]1 Each 3   ??? flash glucose sensor (FREESTYLE LIBRE 14 DAY SENSOR) kit CGM Change sensor every 14 days 6 Kit 3     Social History     Socioeconomic History   ??? Marital status: SINGLE     Spouse name: Not on file   ??? Number of children: Not on file   ??? Years of education: Not on file   ??? Highest education level: Not on file   Tobacco Use   ??? Smoking status: Never Smoker   ??? Smokeless tobacco: Never Used     History reviewed. No pertinent family history.      PHYSICAL EXAM:  Vitals:    07/09/18 0941   BP: 95/71   Pulse: (!) 57   Weight: 200 lb (90.7 kg)   Height: '5\' 5"'$  (1.651 m)     Body mass index is 33.28 kg/m??.    General:   Pleasant female in no apparent distress  Eyes: Conjunctiva / sclera normal  Neck: Supple, no adenopathy; thyroid symmetric  CVS: S1 S2 heard, regular HS  RS: Clear to Auscultation Bilaterally  ABD: ND, NT, BS +,   Extrem:    No edema  Dorsalis pedis palpable  Intact sensation on 10 G monofilament testing on the dorsum of the feet       LABS-  A1c 7.1% 06/10/18  Cholesterol 149  TG 82  HDL 54  LDL 70    06/22/2018  Calcium 9.6  Creatinine 0.83  GFR >60  LFTs:  Normal    Microalbumin/creatinine:  Less than 7.2    ASSESSMENT AND PLAN:    Diagnoses and all orders for this visit:    1. Type 2 diabetes mellitus without complication, with long-term current use of insulin ([facility]     Patient is a 53year old female being seen for evaluation of type 2 diabetes.  Patient was seen in conjunction with KJeani SowRD.   I have reviewed her history above  and agree with it.  Patient is currently on metformin, Amaryl and  Lantus.  She would like to lose some weight.  She is also having low blood sugars overnight.  Does not have any complications of diabetes.  Recent A1c was  7.4%.  Plan  Patient will stop the Lantus and switched to tresiba.  There is decreased incidence of overnight low blood sugars with tresiba.  Patient should continue with the metformin.  At some point, I would also like to stop the Amaryl and if possible switch the patient to Januvia or Victoza.  Patient does not want to do all the changes at once.  She is agreeable to switching to tresiba at present.  Script was sent to start with 20 units daily.  Patient will titrate it by 2 units every 3 days until she reaches her goal fasting blood sugar of 130 or less.  Patient is also interested in [place] continous glucose monitoring sensor.  Script was sent in today.  Will consider switching the patient from 2 Januvia or Victoza at the next visit.  Follow-up in 6 weeks with Jeani Sow RD and 3 months with me.     Josetta Huddle, MD, CDE  07/09/2018    Copy of this note will be faxed to the referring provider.

## 2018-07-15 NOTE — Telephone Encounter (Signed)
Pt lvm stating that the insulin and the glucose scanner are not covered under the pt insurance. She states that she cannot afford these without her insurance.     She would like to know what she needs to do at this point.    Please call pt back regarding

## 2018-07-16 MED ORDER — INSULIN GLARGINE 100 UNIT/ML (3 ML) SUB-Q PEN
100 unit/mL (3 mL) | SUBCUTANEOUS | 3 refills | Status: DC
Start: 2018-07-16 — End: 2018-07-23

## 2018-07-16 NOTE — Telephone Encounter (Signed)
Rx for [employer] sent in.  Med list updated.

## 2018-07-16 NOTE — Telephone Encounter (Signed)
Per Dr. Dwaine Gale okay to do Basaglar and no Josephine Igo for now.  Will discuss with patient at visit.

## 2018-07-16 NOTE — Telephone Encounter (Signed)
Dr. Dwaine Gale the Evaristo Bury is not covered.  Would you like to try Basaglar?  Also on the [place] how would you like to proceede?

## 2018-07-23 MED ORDER — INSULIN DETEMIR 100 UNIT/ML (3 ML) SUB-Q PEN
100 unit/mL (3 mL) | SUBCUTANEOUS | 3 refills | Status: DC
Start: 2018-07-23 — End: 2018-07-24

## 2018-07-23 NOTE — Telephone Encounter (Signed)
Received fax today from pharmacy that Basaglar AND the Evaristo Bury are not covered under patients insurance.  Will send in rx for Levemir since Basaglar is generic Lantus.

## 2018-07-24 MED ORDER — INSULIN DETEMIR 100 UNIT/ML (3 ML) SUB-Q PEN
100 unit/mL (3 mL) | SUBCUTANEOUS | 3 refills | Status: DC
Start: 2018-07-24 — End: 2018-10-21

## 2018-07-24 NOTE — Addendum Note (Signed)
Addended by: Elige Ko on: 07/24/2018 02:22 PM     Modules accepted: Orders

## 2018-07-24 NOTE — Telephone Encounter (Addendum)
Lantus was not approved either.  Will send in rx for Levemir.  Pharmacy sent notification about Lantus but not the Levemir.  Will send in rx again and see what happens.

## 2018-07-24 NOTE — Addendum Note (Signed)
Addendum Note by Elige Ko, CMA at 07/24/18 1422                Author: Elige Ko, CMA  Service: --  Author Type: Medical Assistant       Filed: 07/24/18 1422  Encounter Date: 07/23/2018  Status: Signed          Editor: Elige Ko, CMA (Medical Assistant)          Addended by: Elige Ko on: 07/24/2018 02:22 PM    Modules accepted: Orders

## 2018-08-20 ENCOUNTER — Ambulatory Visit
Admit: 2018-08-20 | Discharge: 2018-08-20 | Payer: PRIVATE HEALTH INSURANCE | Attending: Registered" | Primary: Family Medicine

## 2018-08-20 ENCOUNTER — Ambulatory Visit: Attending: Adult Health | Primary: Family Medicine

## 2018-08-20 DIAGNOSIS — E119 Type 2 diabetes mellitus without complications: Secondary | ICD-10-CM

## 2018-08-20 LAB — AMB POC HEMOGLOBIN A1C
Hemoglobin A1C, POC: 7.9 %
Hemoglobin A1c (POC): 7.9 %

## 2018-08-20 MED ORDER — FLASH GLUCOSE SENSOR KIT
PACK | 11 refills | Status: AC
Start: 2018-08-20 — End: ?

## 2018-08-20 NOTE — Progress Notes (Signed)
[facility]  [address]  [place] [address]  [phone]    Diabetes Visit Note    Victoria Sweeney is a 53 y.o. female who presents for diabetes visit with registered dietitian/certified diabetes educator. Patient was referred by their PCP for diabetes management at our clinic, under the supervision and direction of our providers.     She reports issues obtaining the [place] device. She reports the cost was prohibitive. This was discussed. [employer]  CGM data will be valuable to assess her overnight patterns. She appears to be having fasting hyperglycemia, but there was some concern that she was having hypoglycemia in the overnight. SO this may be a case of rebound hyperglycemia, but more data will be needed to support.     We reviewed her rxs for diabetes. She will consider Toujeo after her insulin supply has been depleted.     Declines foot exam today.    Visit Vitals  BP 98/70 (BP 1 Location: Right arm, BP Patient Position: Sitting)   Pulse 64   Ht '5\' 5"'$  (1.651 m)   Wt 199 lb 8 oz (90.5 kg)   BMI 33.20 kg/m??     Patient Active Problem List   Diagnosis Code   ??? Anxiety F41.9   ??? Gastroesophageal reflux disease K21.9   ??? Hypothyroidism E03.9   ??? Metatarsalgia M77.40   ??? Migraine without aura G43.009   ??? Rotator cuff syndrome M75.100   ??? Type 2 diabetes mellitus without complication ([place]) X32.3       Current Outpatient Medications:   ???  lisinopril (PRINIVIL, ZESTRIL) 2.5 mg tablet, , Disp: , Rfl:   ???  flash glucose sensor (FREESTYLE LIBRE 14 DAY SENSOR) kit, Use to monitor glucose levels. Wear for 14 days, then replace., Disp: 2 Kit, Rfl: 11  ???  insulin detemir U-100 (LEVEMIR FLEXTOUCH U-100 INSULN) 100 unit/mL (3 mL) inpn, Inject 20-30 units once daily, Disp: 30 mL, Rfl: 3  ???  Lancing Device with Lancets (ACCU-CHEK FASTCLIX LANCING DEV) kit, Use twice daily, Disp: , Rfl:   ???  glucose blood VI test strips (ACCU-CHEK SMARTVIEW TEST STRIP) strip,  Take 2 strips every day by miscell. route as needed., Disp: , Rfl:   ???  Insulin Needles, Disposable, (HEALTHY ACCENTS UNIFINE PENTIP) 31 gauge x 5/16" ndle, Healthy Accents Unifine Pentip 31 gauge x 5/16" needle, Disp: , Rfl:   ???  magnesium 250 mg tab, magnesium, Disp: , Rfl:   ???  diazePAM (VALIUM) 10 mg tablet, diazepam 10 mg tablet, Disp: , Rfl:   ???  FLUoxetine (PROZAC) 20 mg capsule, , Disp: , Rfl:   ???  ONETOUCH DELICA PLUS LANCET 33 gauge misc, , Disp: , Rfl:   ???  levothyroxine (SYNTHROID) 100 mcg tablet, Patient taking 136mg, Disp: , Rfl:   ???  metFORMIN (GLUCOPHAGE) 1,000 mg tablet, , Disp: , Rfl:   ???  pantoprazole (PROTONIX) 40 mg tablet, TAKE ONE TABLET BY MOUTH EVERY MORNING, Disp: , Rfl:   ???  prochlorperazine (COMPAZINE) 10 mg tablet, Take 1 tablet 3 times a day by oral route as needed for 10 days., Disp: , Rfl:   ???  propranolol LA (INDERAL LA) 120 mg SR capsule, , Disp: , Rfl:   ???  rosuvastatin (CRESTOR) 20 mg tablet, , Disp: , Rfl:   ???  cholecalciferol (VITAMIN D3) (1000 Units /25 mcg) tablet, Take  by mouth daily., Disp: , Rfl:     Lab Results  Component Value Date/Time    Hemoglobin A1c, External 7.1 05/28/2018     Plan    Individualize A1c goals. 2018 AACE guidelines suggest less than 6.5% in patients without current serious illness and at low risk for hypoglycemia; greater than 6.5% for patients with concurrent serious illness and or at risk for hypoglycemia.     According to the 2018 AACE diabetes algorithm, monotherapy is recommended with an A1c greater than 6.5%-7.5%. Dual therapy is recommended within entry A1c greater than or equal to 7.5%. A1c greater than 9% upon entry with symptoms of hyperglycemia warrants recommendation of starting insulin plus or minus other agents.  In the absence of symptoms of hyperglycemia dual or triple therapy is recommended.                           With monotherapy, if A1c goal is not met in 3 months proceed to dual  therapy, and if A1c goal is not met after following 3 months moved to triple therapy.  If A1c not at goal in subsequent 3 months proceed to or intensify insulin therapy.        Greater than 50% of today's visit was spent engaged in face-to-face counseling and education with the patient.   A copy of today's visit will be sent to the referring provider.     Time: [place] MS, RD, LD, CDE

## 2018-08-20 NOTE — Progress Notes (Signed)
I have reviewed the notes, assessments, and/or procedures performed by Kaileigh Porter RD CDE, I concur with her/his documentation of Victoria Sweeney.

## 2018-08-20 NOTE — Patient Instructions (Signed)
When you are close to needing a refill on your Lantus, please call and we will consider sending an rx for Toujeo, which has a good drug action profile that might work a little better for you and is Tier 2 on your formulary.     Don't pay more than $75 for a Libre sensor rx, [employer] which owns Josephine Igo will reduce the price to this amount, so just decline it and call us.     It is OK to restart some PM insulin, such as 5-10 units before bed, due to AM hyperglycemia. But please first get an overnight picture of glucose patterns with the trial CGM

## 2018-08-20 NOTE — Progress Notes (Signed)
[facility]  [address]  [place] [address]  [phone]    Diabetes Visit Note    Victoria Sweeney is a 53 y.o. female who presents for diabetes visit with registered dietitian/certified diabetes educator. Patient was referred by their PCP for diabetes management at our clinic, under the supervision and direction of our providers.     She reports issues obtaining the [place] device. She reports the cost was prohibitive. This was discussed. [employer]  CGM data will be valuable to assess her overnight patterns. She appears to be having fasting hyperglycemia, but there was some concern that she was having hypoglycemia in the overnight. SO this may be a case of rebound hyperglycemia, but more data will be needed to support.     We reviewed her rxs for diabetes. She will consider Toujeo after her insulin supply has been depleted.     Declines foot exam today.    Visit Vitals  BP 98/70 (BP 1 Location: Right arm, BP Patient Position: Sitting)   Pulse 64   Ht '5\' 5"'$  (1.651 m)   Wt 199 lb 8 oz (90.5 kg)   BMI 33.20 kg/m??     Patient Active Problem List   Diagnosis Code   ??? Anxiety F41.9   ??? Gastroesophageal reflux disease K21.9   ??? Hypothyroidism E03.9   ??? Metatarsalgia M77.40   ??? Migraine without aura G43.009   ??? Rotator cuff syndrome M75.100   ??? Type 2 diabetes mellitus without complication ([place]) Q65.7       Current Outpatient Medications:   ???  lisinopril (PRINIVIL, ZESTRIL) 2.5 mg tablet, , Disp: , Rfl:   ???  flash glucose sensor (FREESTYLE LIBRE 14 DAY SENSOR) kit, Use to monitor glucose levels. Wear for 14 days, then replace., Disp: 2 Kit, Rfl: 11  ???  insulin detemir U-100 (LEVEMIR FLEXTOUCH U-100 INSULN) 100 unit/mL (3 mL) inpn, Inject 20-30 units once daily, Disp: 30 mL, Rfl: 3  ???  Lancing Device with Lancets (ACCU-CHEK FASTCLIX LANCING DEV) kit, Use twice daily, Disp: , Rfl:   ???  glucose blood VI test strips (ACCU-CHEK SMARTVIEW TEST STRIP) strip, Take 2 strips every day by miscell.  route as needed., Disp: , Rfl:   ???  Insulin Needles, Disposable, (HEALTHY ACCENTS UNIFINE PENTIP) 31 gauge x 5/16" ndle, Healthy Accents Unifine Pentip 31 gauge x 5/16" needle, Disp: , Rfl:   ???  magnesium 250 mg tab, magnesium, Disp: , Rfl:   ???  diazePAM (VALIUM) 10 mg tablet, diazepam 10 mg tablet, Disp: , Rfl:   ???  FLUoxetine (PROZAC) 20 mg capsule, , Disp: , Rfl:   ???  ONETOUCH DELICA PLUS LANCET 33 gauge misc, , Disp: , Rfl:   ???  levothyroxine (SYNTHROID) 100 mcg tablet, Patient taking 129mg, Disp: , Rfl:   ???  metFORMIN (GLUCOPHAGE) 1,000 mg tablet, , Disp: , Rfl:   ???  pantoprazole (PROTONIX) 40 mg tablet, TAKE ONE TABLET BY MOUTH EVERY MORNING, Disp: , Rfl:   ???  prochlorperazine (COMPAZINE) 10 mg tablet, Take 1 tablet 3 times a day by oral route as needed for 10 days., Disp: , Rfl:   ???  propranolol LA (INDERAL LA) 120 mg SR capsule, , Disp: , Rfl:   ???  rosuvastatin (CRESTOR) 20 mg tablet, , Disp: , Rfl:   ???  cholecalciferol (VITAMIN D3) (1000 Units /25 mcg) tablet, Take  by mouth daily., Disp: , Rfl:     Lab Results  Component Value Date/Time    Hemoglobin A1c, External 7.1 05/28/2018     Plan    Individualize A1c goals. 2018 AACE guidelines suggest less than 6.5% in patients without current serious illness and at low risk for hypoglycemia; greater than 6.5% for patients with concurrent serious illness and or at risk for hypoglycemia.     According to the 2018 AACE diabetes algorithm, monotherapy is recommended with an A1c greater than 6.5%-7.5%. Dual therapy is recommended within entry A1c greater than or equal to 7.5%. A1c greater than 9% upon entry with symptoms of hyperglycemia warrants recommendation of starting insulin plus or minus other agents.  In the absence of symptoms of hyperglycemia dual or triple therapy is recommended.                           With monotherapy, if A1c goal is not met in 3 months proceed to dual therapy, and if A1c goal is not met after following 3 months moved to triple  therapy.  If A1c not at goal in subsequent 3 months proceed to or intensify insulin therapy.        Greater than 50% of today's visit was spent engaged in face-to-face counseling and education with the patient.   A copy of today's visit will be sent to the referring provider.     Time: [place] MS, RD, LD, CDE

## 2018-08-20 NOTE — Progress Notes (Signed)
I have reviewed the notes, assessments, and/or procedures performed by Mickeal Needy RD CDE, I concur with her/his documentation of Victoria Sweeney.

## 2018-09-19 ENCOUNTER — Encounter: Attending: Registered" | Primary: Family Medicine

## 2018-10-15 NOTE — Telephone Encounter (Signed)
Called pt regarding appt scheduled for 10/21/2018. Pt has the option to switch to virtual visit or r/s per COVID-19.     Pt did not pu. lvm for pt to call the office back regarding.

## 2018-10-21 ENCOUNTER — Telehealth
Admit: 2018-10-21 | Discharge: 2018-10-21 | Payer: PRIVATE HEALTH INSURANCE | Attending: "Endocrinology | Primary: Family Medicine

## 2018-10-21 ENCOUNTER — Telehealth: Attending: "Endocrinology | Primary: Family Medicine

## 2018-10-21 DIAGNOSIS — E119 Type 2 diabetes mellitus without complications: Secondary | ICD-10-CM

## 2018-10-21 MED ORDER — METFORMIN 1,000 MG TAB
1000 mg | ORAL_TABLET | Freq: Two times a day (BID) | ORAL | 3 refills | Status: AC
Start: 2018-10-21 — End: ?

## 2018-10-21 NOTE — Telephone Encounter (Signed)
Done

## 2018-10-21 NOTE — Telephone Encounter (Signed)
Victoria Sweeney, please update this record  foot exam by Dr. Dwaine Gale in January, 2020.  a pap test @ [facility] in July, 2019.   an annual eye exam in July, 2019.

## 2018-10-21 NOTE — Telephone Encounter (Signed)
appt made and letter mailed

## 2018-10-21 NOTE — Telephone Encounter (Signed)
Victoria Sweeney,  Please update the patient's med list.   She is on Lantus 20-25 units in the morning and 5-10 units at night and not on the Levemir.  Please change the Amaryl to 2 mg daily.    Please send a new script for the Lantus, Amaryl and also metformin 1000 mg twice daily.    Marissa,  Please schedule a 3 month follow-up.

## 2018-10-21 NOTE — Telephone Encounter (Signed)
Please put her on the Amaryl.

## 2018-10-21 NOTE — Telephone Encounter (Signed)
Amaryl is not on her current med list and you discontinued it on 07/09/18

## 2018-10-21 NOTE — Progress Notes (Signed)
[facility]   [place] [address]  [phone]      TELEMEDICINE VISIT (Performed via Telephone or Audio & Video)     METHOD OF VISIT: VV Type: Video and Audio  PROVIDER LOCATION:  [facility] Telehealth Provider Location: Office  PATIENT LOCATION: Home  PARTICIPANTS: Patient    BILLING CODE:  Tele-Health (Audio / Video) Visit:  Billing for this visit is not time based - Bill LOS Code  TOTAL TIME SPENT WITH PATIENT:  20 minutes      ? Recent results reviewed in the electronic health record.  o I specifically reviewed the following: Imaging, Labs and Previous Office Encounters  ? Problems, Meds and Allergies were reviewed.  ? Patient gave verbal consent for this TeleHealth visit.        Last Office visit:   07/09/2018     Endocrinology History:    Victoria Sweeney is a 53 y.o. female with type 2 diabetes.    Diagnosed with diabetes in 2014.Started on insulin around 2018. Initially lost 40 lbs with diligent effort, but has put on 10 lbs since then.   We prescribed Toujeo at the last visit but was not approved by the insurance.  She is on Lantus 20 units in the morning and 7 units at bedtime.  Her morning numbers were high when she was off the evening dose of Lantus.  With the 7 units she is waking up at 2:00 a.m. with blood sugars of 40s.    Constitutional:???????????????????????? Weight stable  ?????????????????????????????????????????????????????????????????????? Denies polydipsia  Eyes:???????????????????????????????????????????????????? Denies blurred vision  ??  Current Diabetic regimen includes:  ??  Metformin 1000 mg twice daily  Amaryl 4 mg daily  Now on Lantus 20 units in the morning and 7 units at night.      SMBG  Frequency: checks 1-3  times a day  Fingerstick range:   Pre Breakfast:  [id number]  2 hours after Lunch: [id number]  Bedtime, 2 to 3 hours after dinner:  [id number]  ??  Hypoglycemia:   Lowest Blood sugar:[id number]     Gets symptomatic, patient knows how to treat low blood sugars     Previous DM education:??????   Is interested/ not interested in further DM Education.  ??  Exercise:  ADLs, is joining the gym tomorrow, plans to go four days a week and walk on the treadmill  ??  ROUTINE CARE:  Eye checks:?????? Yes/ - Last July 2019, on Datto checks:???? Yes/ - Nightly, had them checked 6 weeks ago, no signs of neuropathy   Flu Vaccine:?????? Yes/   PNA vaccine:?? Yes/   Aspirin:?????????????????????? No  ACEI/ARB:?????????? No  Statin:?????????????????????????? Yes                COMPLICATIONS:  Cardio-vascular -Denies   Retinopathy???????? - Denies  Kidneys ?????????????????? ??-Denies   Feet?? ???????????????????????????? - Denies     Denies any recent new diagnosis/hospitalization or new medication.    Past Medical History:   Diagnosis Date   ??? Asplenia    ??? DM (diabetes mellitus) ([place])    ??? GAD (generalized anxiety disorder)    ??? H/O esophageal reflux    ??? HTN (hypertension)    ??? IBS (irritable bowel syndrome)    ??? 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 Mixed hyperlipidemia    ??? Obesity    ??? Trochanteric bursitis, left hip    ??? Urticaria  Past Surgical History:   Procedure Laterality Date   ??? HX CHOLECYSTECTOMY     ??? HX SPLENECTOMY     ??? HX THYROIDECTOMY     ??? HX TONSILLECTOMY         History reviewed. No pertinent family history.    Social History     Socioeconomic History   ??? Marital status: SINGLE     Spouse name: Not on file   ??? Number of children: Not on file   ??? Years of education: Not on file   ??? Highest education level: Not on file   Occupational History   ??? Not on file   Social Needs   ??? Financial resource strain: Not on file   ??? Food insecurity     Worry: Not on file     Inability: Not on file   ??? Transportation needs     Medical: Not on file     Non-medical: Not on file   Tobacco Use   ??? Smoking status: Never Smoker   ??? Smokeless tobacco: Never Used   Substance and Sexual Activity   ??? Alcohol use: Yes     Frequency: Monthly or less   ??? Drug use: Never   ??? Sexual activity: Not on file   Lifestyle    ??? Physical activity     Days per week: Not on file     Minutes per session: Not on file   ??? Stress: Not on file   Relationships   ??? Social Product manager on phone: Not on file     Gets together: Not on file     Attends religious service: Not on file     Active member of club or organization: Not on file     Attends meetings of clubs or organizations: Not on file     Relationship status: Not on file   ??? Intimate partner violence     Fear of current or ex partner: Not on file     Emotionally abused: Not on file     Physically abused: Not on file     Forced sexual activity: Not on file   Other Topics Concern   ??? Not on file   Social History Narrative   ??? Not on file         Current Outpatient Medications:   ???  lisinopril (PRINIVIL, ZESTRIL) 2.5 mg tablet, , Disp: , Rfl:   ???  flash glucose sensor (FREESTYLE LIBRE 14 DAY SENSOR) kit, Use to monitor glucose levels. Wear for 14 days, then replace., Disp: 2 Kit, Rfl: 11  ???  insulin detemir U-100 (LEVEMIR FLEXTOUCH U-100 INSULN) 100 unit/mL (3 mL) inpn, Inject 20-30 units once daily, Disp: 30 mL, Rfl: 3  ???  Lancing Device with Lancets (ACCU-CHEK FASTCLIX LANCING DEV) kit, Use twice daily, Disp: , Rfl:   ???  glucose blood VI test strips (ACCU-CHEK SMARTVIEW TEST STRIP) strip, Take 2 strips every day by miscell. route as needed., Disp: , Rfl:   ???  Insulin Needles, Disposable, (HEALTHY ACCENTS UNIFINE PENTIP) 31 gauge x 5/16" ndle, Healthy Accents Unifine Pentip 31 gauge x 5/16" needle, Disp: , Rfl:   ???  magnesium 250 mg tab, magnesium, Disp: , Rfl:   ???  diazePAM (VALIUM) 10 mg tablet, diazepam 10 mg tablet, Disp: , Rfl:   ???  FLUoxetine (PROZAC) 20 mg capsule, , Disp: , Rfl:   ???  ONETOUCH DELICA PLUS LANCET 33 gauge misc, ,  Disp: , Rfl:   ???  levothyroxine (SYNTHROID) 100 mcg tablet, Patient taking 145mg, Disp: , Rfl:   ???  metFORMIN (GLUCOPHAGE) 1,000 mg tablet, , Disp: , Rfl:   ???  pantoprazole (PROTONIX) 40 mg tablet, TAKE ONE TABLET BY MOUTH EVERY  MORNING, Disp: , Rfl:   ???  prochlorperazine (COMPAZINE) 10 mg tablet, Take 1 tablet 3 times a day by oral route as needed for 10 days., Disp: , Rfl:   ???  propranolol LA (INDERAL LA) 120 mg SR capsule, , Disp: , Rfl:   ???  rosuvastatin (CRESTOR) 20 mg tablet, , Disp: , Rfl:   ???  cholecalciferol (VITAMIN D3) (1000 Units /25 mcg) tablet, Take  by mouth daily., Disp: , Rfl:     There are no discontinued medications.    Allergies   Allergen Reactions   ??? Other Food Unknown (comments)     DHE-45   ??? Codeine Shortness of Breath     Hives, tachycardia   ??? Contrast Agent [Iodine] Shortness of Breath     Sob, nv, diarrhea,hypotension   ??? Morphine Other (comments)     Hypotension, sob   ??? Amoxicillin-Pot Clavulanate Nausea and Vomiting     diarrhea   ??? Ceftin [Cefuroxime Axetil] Unknown (comments)   ??? Chocolate Flavor Unknown (comments)   ??? Ergotamine Nausea and Vomiting   ??? Erythromycin Nausea and Vomiting   ??? Hydrocodone-Ibuprofen Unknown (comments)   ??? Imitrex [Sumatriptan Succinate] Other (comments)     Tachycardia, rash sob swelling   ??? Lipitor [Atorvastatin] Unknown (comments)   ??? Milk Containing Products Unknown (comments)   ??? Tomato Other (comments)     headache   ??? Tramadol Hives   ??? Vicodin [Hydrocodone-Acetaminophen] Nausea and Vomiting     diarrhea         REVIEW OF SYSTEMS     ENDOCRINE AND ENT system  was reviewed and was negative.    PHYSICAL EXAM   Not done      LABS/IMAGING     The following labs were reviewed in this visit with the patient:      Office Visit on 08/20/2018   Component Date Value Ref Range Status   ??? Hemoglobin A1c (POC) 08/20/2018 7.9  % Final   Abstract on 07/09/2018   Component Date Value Ref Range Status   ??? Hemoglobin A1c, External 05/28/2018 7.1   Final     A1c 7.1% 06/10/18  Cholesterol 149  TG 82  HDL 54  LDL 70  ??  06/22/2018  Calcium 9.6  Creatinine 0.83  GFR >60  LFTs:  Normal  ??  Microalbumin/creatinine:  Less than 7.2    ASSESSMENT AND PLAN:     Diagnoses and all orders for this visit:    1. Type 2 diabetes mellitus without complication, with long-term current use of insulin ([place]    Patient is a 89year female with type 2 diabetes.  Diabetes is complicated by overnight low blood sugars.  Unfortunately, her insurance would not cover tresiba or Toujeo.  She is currently on Amaryl 4 mg, metformin 1000 mg twice daily, Lantus 20 units in the morning and 7 units at night.  Wakes up with blood sugars in the 40s at night.  She was high when she had stop the nighttime dose of Lantus.  Plan  Decrease the Amaryl to 2 mg daily  Continue with Lantus 20 units in the morning, decrease the nighttime to 5 units  Continue with metformin.  Blood glucose monitoring 3 times a day.  Patient will send me an e-mail with blood sugars in 2 weeks so we can adjust the medication.  She might need a higher dose of Lantus now that we decreased .  She is not interested in starting SGLT 2 inhibitors or a GLP1 agonist at this point.  Routine health maintenance up-to-date.  FOLLOW-UP IN 3 MONTHS WITH A1C AT THE TIME THE VISIT.    Josetta Huddle, MD  10/21/2018    Copy of this note will be faxed to the referring provider.

## 2018-10-21 NOTE — Progress Notes (Signed)
[facility]   [place] [address]  [phone]      TELEMEDICINE VISIT (Performed via Telephone or Audio & Video)     METHOD OF VISIT: VV Type: Video and Audio  PROVIDER LOCATION:  [facility] Telehealth Provider Location: Office  PATIENT LOCATION: Home  PARTICIPANTS: Patient    BILLING CODE:  Tele-Health (Audio / Video) Visit:  Billing for this visit is not time based - Bill LOS Code  TOTAL TIME SPENT WITH PATIENT:  20 minutes      ??? Recent results reviewed in the electronic health record.  o I specifically reviewed the following: Imaging, Labs and Previous Office Encounters  ??? Problems, Meds and Allergies were reviewed.  ??? Patient gave verbal consent for this TeleHealth visit.        Last Office visit:   07/09/2018     Endocrinology History:    Victoria Sweeney is a 53 y.o. female with type 2 diabetes.    Diagnosed with diabetes in 2014.Started on insulin around 2018. Initially lost 40 lbs with diligent effort, but has put on 10 lbs since then.   We prescribed Toujeo at the last visit but was not approved by the insurance.  She is on Lantus 20 units in the morning and 7 units at bedtime.  Her morning numbers were high when she was off the evening dose of Lantus.  With the 7 units she is waking up at 2:00 a.m. with blood sugars of 40s.    Constitutional:???????????????????????? Weight stable  ?????????????????????????????????????????????????????????????????????? Denies polydipsia  Eyes:???????????????????????????????????????????????????? Denies blurred vision  ??  Current Diabetic regimen includes:  ??  Metformin 1000 mg twice daily  Amaryl 4 mg daily  Now on Lantus 20 units in the morning and 7 units at night.      SMBG  Frequency: checks 1-3  times a day  Fingerstick range:   Pre Breakfast:  [id number]  2 hours after Lunch: [id number]  Bedtime, 2 to 3 hours after dinner:  [id number]  ??  Hypoglycemia:   Lowest Blood sugar:[id number]     Gets symptomatic, patient knows how to treat low blood  sugars    Previous DM education:??????   Is interested/ not interested in further DM Education.  ??  Exercise:  ADLs, is joining the gym tomorrow, plans to go four days a week and walk on the treadmill  ??  ROUTINE CARE:  Eye checks:?????? Yes/ - Last July 2019, on [place] checks:???? Yes/ - Nightly, had them checked 6 weeks ago, no signs of neuropathy   Flu Vaccine:?????? Yes/   PNA vaccine:?? Yes/   Aspirin:?????????????????????? No  ACEI/ARB:?????????? No  Statin:?????????????????????????? Yes                COMPLICATIONS:  Cardio-vascular -Denies   Retinopathy???????? - Denies  Kidneys ?????????????????? ??-Denies   Feet?? ???????????????????????????? - Denies     Denies any recent new diagnosis/hospitalization or new medication.    Past Medical History:   Diagnosis Date   ??? Asplenia    ??? DM (diabetes mellitus) ([place])    ??? GAD (generalized anxiety disorder)    ??? H/O esophageal reflux    ??? HTN (hypertension)    ??? IBS (irritable bowel syndrome)    ??? 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 Mixed hyperlipidemia    ??? Obesity    ??? Trochanteric bursitis, left hip    ??? Urticaria  Past Surgical History:   Procedure Laterality Date   ??? HX CHOLECYSTECTOMY     ??? HX SPLENECTOMY     ??? HX THYROIDECTOMY     ??? HX TONSILLECTOMY         History reviewed. No pertinent family history.    Social History     Socioeconomic History   ??? Marital status: SINGLE     Spouse name: Not on file   ??? Number of children: Not on file   ??? Years of education: Not on file   ??? Highest education level: Not on file   Occupational History   ??? Not on file   Social Needs   ??? Financial resource strain: Not on file   ??? Food insecurity     Worry: Not on file     Inability: Not on file   ??? Transportation needs     Medical: Not on file     Non-medical: Not on file   Tobacco Use   ??? Smoking status: Never Smoker   ??? Smokeless tobacco: Never Used   Substance and Sexual Activity   ??? Alcohol use: Yes     Frequency: Monthly or less   ??? Drug use: Never   ??? Sexual activity: Not on file   Lifestyle    ??? Physical activity     Days per week: Not on file     Minutes per session: Not on file   ??? Stress: Not on file   Relationships   ??? Social Product manager on phone: Not on file     Gets together: Not on file     Attends religious service: Not on file     Active member of club or organization: Not on file     Attends meetings of clubs or organizations: Not on file     Relationship status: Not on file   ??? Intimate partner violence     Fear of current or ex partner: Not on file     Emotionally abused: Not on file     Physically abused: Not on file     Forced sexual activity: Not on file   Other Topics Concern   ??? Not on file   Social History Narrative   ??? Not on file         Current Outpatient Medications:   ???  lisinopril (PRINIVIL, ZESTRIL) 2.5 mg tablet, , Disp: , Rfl:   ???  flash glucose sensor (FREESTYLE LIBRE 14 DAY SENSOR) kit, Use to monitor glucose levels. Wear for 14 days, then replace., Disp: 2 Kit, Rfl: 11  ???  insulin detemir U-100 (LEVEMIR FLEXTOUCH U-100 INSULN) 100 unit/mL (3 mL) inpn, Inject 20-30 units once daily, Disp: 30 mL, Rfl: 3  ???  Lancing Device with Lancets (ACCU-CHEK FASTCLIX LANCING DEV) kit, Use twice daily, Disp: , Rfl:   ???  glucose blood VI test strips (ACCU-CHEK SMARTVIEW TEST STRIP) strip, Take 2 strips every day by miscell. route as needed., Disp: , Rfl:   ???  Insulin Needles, Disposable, (HEALTHY ACCENTS UNIFINE PENTIP) 31 gauge x 5/16" ndle, Healthy Accents Unifine Pentip 31 gauge x 5/16" needle, Disp: , Rfl:   ???  magnesium 250 mg tab, magnesium, Disp: , Rfl:   ???  diazePAM (VALIUM) 10 mg tablet, diazepam 10 mg tablet, Disp: , Rfl:   ???  FLUoxetine (PROZAC) 20 mg capsule, , Disp: , Rfl:   ???  ONETOUCH DELICA PLUS LANCET 33 gauge misc, ,  Disp: , Rfl:   ???  levothyroxine (SYNTHROID) 100 mcg tablet, Patient taking 124mg, Disp: , Rfl:   ???  metFORMIN (GLUCOPHAGE) 1,000 mg tablet, , Disp: , Rfl:   ???  pantoprazole (PROTONIX) 40 mg tablet, TAKE ONE TABLET BY MOUTH EVERY MORNING, Disp: ,  Rfl:   ???  prochlorperazine (COMPAZINE) 10 mg tablet, Take 1 tablet 3 times a day by oral route as needed for 10 days., Disp: , Rfl:   ???  propranolol LA (INDERAL LA) 120 mg SR capsule, , Disp: , Rfl:   ???  rosuvastatin (CRESTOR) 20 mg tablet, , Disp: , Rfl:   ???  cholecalciferol (VITAMIN D3) (1000 Units /25 mcg) tablet, Take  by mouth daily., Disp: , Rfl:     There are no discontinued medications.    Allergies   Allergen Reactions   ??? Other Food Unknown (comments)     DHE-45   ??? Codeine Shortness of Breath     Hives, tachycardia   ??? Contrast Agent [Iodine] Shortness of Breath     Sob, nv, diarrhea,hypotension   ??? Morphine Other (comments)     Hypotension, sob   ??? Amoxicillin-Pot Clavulanate Nausea and Vomiting     diarrhea   ??? Ceftin [Cefuroxime Axetil] Unknown (comments)   ??? Chocolate Flavor Unknown (comments)   ??? Ergotamine Nausea and Vomiting   ??? Erythromycin Nausea and Vomiting   ??? Hydrocodone-Ibuprofen Unknown (comments)   ??? Imitrex [Sumatriptan Succinate] Other (comments)     Tachycardia, rash sob swelling   ??? Lipitor [Atorvastatin] Unknown (comments)   ??? Milk Containing Products Unknown (comments)   ??? Tomato Other (comments)     headache   ??? Tramadol Hives   ??? Vicodin [Hydrocodone-Acetaminophen] Nausea and Vomiting     diarrhea         REVIEW OF SYSTEMS     ENDOCRINE AND ENT system  was reviewed and was negative.    PHYSICAL EXAM   Not done      LABS/IMAGING     The following labs were reviewed in this visit with the patient:      Office Visit on 08/20/2018   Component Date Value Ref Range Status   ??? Hemoglobin A1c (POC) 08/20/2018 7.9  % Final   Abstract on 07/09/2018   Component Date Value Ref Range Status   ??? Hemoglobin A1c, External 05/28/2018 7.1   Final     A1c 7.1% 06/10/18  Cholesterol 149  TG 82  HDL 54  LDL 70  ??  06/22/2018  Calcium 9.6  Creatinine 0.83  GFR >60  LFTs:  Normal  ??  Microalbumin/creatinine:  Less than 7.2    ASSESSMENT AND PLAN:    Diagnoses and all orders for this visit:    1. Type 2  diabetes mellitus without complication, with long-term current use of insulin ([facility]    Patient is a 89year female with type 2 diabetes.  Diabetes is complicated by overnight low blood sugars.  Unfortunately, her insurance would not cover tresiba or Toujeo.  She is currently on Amaryl 4 mg, metformin 1000 mg twice daily, Lantus 20 units in the morning and 7 units at night.  Wakes up with blood sugars in the 40s at night.  She was high when she had stop the nighttime dose of Lantus.  Plan  Decrease the Amaryl to 2 mg daily  Continue with Lantus 20 units in the morning, decrease the nighttime to 5 units  Continue with metformin.  Blood glucose monitoring 3 times a day.  Patient will send me an e-mail with blood sugars in 2 weeks so we can adjust the medication.  She might need a higher dose of Lantus now that we decreased .  She is not interested in starting SGLT 2 inhibitors or a GLP1 agonist at this point.  Routine health maintenance up-to-date.  FOLLOW-UP IN 3 MONTHS WITH A1C AT THE TIME THE VISIT.    Josetta Huddle, MD  10/21/2018    Copy of this note will be faxed to the referring provider.

## 2018-10-25 MED ORDER — GLIMEPIRIDE 2 MG TAB
2 mg | ORAL_TABLET | ORAL | 3 refills | Status: DC
Start: 2018-10-25 — End: 2019-12-29

## 2018-10-25 NOTE — Telephone Encounter (Signed)
Done.

## 2018-10-25 NOTE — Addendum Note (Signed)
Addended by: Elige Ko on: 10/25/2018 12:47 PM     Modules accepted: Orders

## 2018-10-25 NOTE — Addendum Note (Signed)
Addendum Note by Elige Ko, CMA at 10/25/18 1247                Author: Elige Ko, CMA  Service: --  Author Type: Medical Assistant       Filed: 10/25/18 1247  Encounter Date: 10/21/2018  Status: Signed          Editor: Elige Ko, CMA (Medical Assistant)          Addended by: Elige Ko on: 10/25/2018 12:47 PM    Modules accepted: Orders

## 2019-01-15 MED ORDER — INSULIN GLARGINE 100 UNIT/ML (3 ML) SUB-Q PEN
100 unit/mL (3 mL) | SUBCUTANEOUS | 3 refills | Status: DC
Start: 2019-01-15 — End: 2020-04-20

## 2019-01-15 NOTE — Telephone Encounter (Signed)
Got her email.  Rx sent in.

## 2019-01-15 NOTE — Telephone Encounter (Signed)
Pt called and left vm stating that she has an appt with Dr. Kirtland Bouchard on Monday and she will be running out of Lantus before then, she wasn't sure if Dr. Kirtland Bouchard planned on changing her medication to something different or keeping her on the Lantus but if she keeps her on it she needs a refill sent to [facility] in [place], she takes 20 units in am and 5 units in pm, her PCP did an A1C in the office and it was 6.8, her numbers have been good. Please call her to let her know the plan [phone]

## 2019-01-20 ENCOUNTER — Ambulatory Visit
Admit: 2019-01-20 | Discharge: 2019-01-20 | Payer: PRIVATE HEALTH INSURANCE | Attending: "Endocrinology | Primary: Family Medicine

## 2019-01-20 ENCOUNTER — Ambulatory Visit: Attending: "Endocrinology | Primary: Family Medicine

## 2019-01-20 DIAGNOSIS — E119 Type 2 diabetes mellitus without complications: Secondary | ICD-10-CM

## 2019-01-20 LAB — HEMOGLOBIN A1C
Hemoglobin A1C, External: 6.8 NA
Hemoglobin A1c, External: 6.8

## 2019-01-20 NOTE — Patient Instructions (Signed)
Stop the amaryl.   If still with low blood sugars, can decrease the Night time dose of lantus by 2 units.   A1c in 3 months.   If A1C is still good, we can add Jardiance and see if we can lower the insulin.

## 2019-01-20 NOTE — Progress Notes (Signed)
[facility]   [place] [address]  [phone]        Last Office visit:   10/21/2018       Endocrinology History:    Victoria Sweeney is a 53 y.o. female being seen for follow-up of type 2 diabetes.    Patient was diagnosed  with diabetes in 2014.Started on insulin around 2018.  She has been exercising in the interim.  Lost 5 lb.  She is on the lower dose of glimepiride.  Her lows have improved but she continues to have some lows.  She is also complaining of increased sweats.  This started the increase in the dose of the Prozac.  The dose was lowered but she continues to have increased sweating.  ??  Current Diabetic regimen includes:  ??  Metformin 1000 mg twice daily  Amaryl 2 mg daily  Now on Lantus 20 units in the morning and 5 units at night.      SMBG  Frequency: checks 1-3  times a day  Fingerstick range:   Pre Breakfast:  168, 61, 106, 89, 106, 129, 110, 117, 108, 118, 96, 86, 128, 132, 64, 80  2 hours after Lunch:  139, 160, 68, 156, 66, 135  Bedtime, 2 to 3 hours after dinner:  65, 178, 161, 147, 105, 100, 186, 91, 129,   ??  Hypoglycemia:   Lowest Blood sugar:  Lowest 66  Gets symptomatic, patient knows how to treat low blood sugars  ??  Routine health maintenance up-to-date.  No known complications of diabetes.              Denies any recent new diagnosis/hospitalization or new medication.    Past Medical History:   Diagnosis Date   ??? Asplenia    ??? DM (diabetes mellitus) ([place])    ??? GAD (generalized anxiety disorder)    ??? H/O esophageal reflux    ??? HTN (hypertension)    ??? IBS (irritable bowel syndrome)    ??? 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 Mixed hyperlipidemia    ??? Obesity    ??? Trochanteric bursitis, left hip    ??? Urticaria        Past Surgical History:   Procedure Laterality Date   ??? HX CHOLECYSTECTOMY     ??? HX SPLENECTOMY     ??? HX THYROIDECTOMY     ??? HX TONSILLECTOMY         History reviewed. No pertinent family history.    Social History      Socioeconomic History   ??? Marital status: SINGLE     Spouse name: Not on file   ??? Number of children: Not on file   ??? Years of education: Not on file   ??? Highest education level: Not on file   Occupational History   ??? Not on file   Social Needs   ??? Financial resource strain: Not on file   ??? Food insecurity     Worry: Not on file     Inability: Not on file   ??? Transportation needs     Medical: Not on file     Non-medical: Not on file   Tobacco Use   ??? Smoking status: Never Smoker   ??? Smokeless tobacco: Never Used   Substance and Sexual Activity   ??? Alcohol use: Yes     Frequency: Monthly or less   ??? Drug use: Never   ??? Sexual activity:  Not on file   Lifestyle   ??? Physical activity     Days per week: Not on file     Minutes per session: Not on file   ??? Stress: Not on file   Relationships   ??? Social Product manager on phone: Not on file     Gets together: Not on file     Attends religious service: Not on file     Active member of club or organization: Not on file     Attends meetings of clubs or organizations: Not on file     Relationship status: Not on file   ??? Intimate partner violence     Fear of current or ex partner: Not on file     Emotionally abused: Not on file     Physically abused: Not on file     Forced sexual activity: Not on file   Other Topics Concern   ??? Not on file   Social History Narrative   ??? Not on file         Current Outpatient Medications:   ???  multivitamin (ONE A DAY) tablet, Take 1 Tab by mouth daily., Disp: , Rfl:   ???  insulin glargine (LANTUS,BASAGLAR) 100 unit/mL (3 mL) inpn, Inject 20-25 units am and 5-10 units pm, Disp: 60 mL, Rfl: 3  ???  glimepiride (AMARYL) 2 mg tablet, Take 1 Tab by mouth every morning., Disp: 90 Tab, Rfl: 3  ???  metFORMIN (GLUCOPHAGE) 1,000 mg tablet, Take 1 Tab by mouth two (2) times a day., Disp: 180 Tab, Rfl: 3  ???  lisinopril (PRINIVIL, ZESTRIL) 2.5 mg tablet, , Disp: , Rfl:   ???  flash glucose sensor (FREESTYLE LIBRE 14 DAY SENSOR) kit, Use to  monitor glucose levels. Wear for 14 days, then replace., Disp: 2 Kit, Rfl: 11  ???  Lancing Device with Lancets (ACCU-CHEK FASTCLIX LANCING DEV) kit, Use twice daily, Disp: , Rfl:   ???  glucose blood VI test strips (ACCU-CHEK SMARTVIEW TEST STRIP) strip, Take 2 strips every day by miscell. route as needed., Disp: , Rfl:   ???  Insulin Needles, Disposable, (HEALTHY ACCENTS UNIFINE PENTIP) 31 gauge x 5/16" ndle, Healthy Accents Unifine Pentip 31 gauge x 5/16" needle, Disp: , Rfl:   ???  magnesium 250 mg tab, magnesium, Disp: , Rfl:   ???  diazePAM (VALIUM) 10 mg tablet, diazepam 10 mg tablet, Disp: , Rfl:   ???  FLUoxetine (PROZAC) 20 mg capsule, , Disp: , Rfl:   ???  ONETOUCH DELICA PLUS LANCET 33 gauge misc, , Disp: , Rfl:   ???  levothyroxine (SYNTHROID) 100 mcg tablet, Patient taking 17mg, Disp: , Rfl:   ???  pantoprazole (PROTONIX) 40 mg tablet, TAKE ONE TABLET BY MOUTH EVERY MORNING, Disp: , Rfl:   ???  prochlorperazine (COMPAZINE) 10 mg tablet, Take 1 tablet 3 times a day by oral route as needed for 10 days., Disp: , Rfl:   ???  propranolol LA (INDERAL LA) 120 mg SR capsule, , Disp: , Rfl:   ???  rosuvastatin (CRESTOR) 20 mg tablet, , Disp: , Rfl:   ???  cholecalciferol (VITAMIN D3) (1000 Units /25 mcg) tablet, Take  by mouth daily., Disp: , Rfl:     There are no discontinued medications.    Allergies   Allergen Reactions   ??? Other Food Unknown (comments)     DHE-45   ??? Codeine Shortness of Breath     Hives, tachycardia   ???  Contrast Agent [Iodine] Shortness of Breath     Sob, nv, diarrhea,hypotension   ??? Morphine Other (comments)     Hypotension, sob   ??? Amoxicillin-Pot Clavulanate Nausea and Vomiting     diarrhea   ??? Ceftin [Cefuroxime Axetil] Unknown (comments)   ??? Chocolate Flavor Unknown (comments)   ??? Ergotamine Nausea and Vomiting   ??? Erythromycin Nausea and Vomiting   ??? Hydrocodone-Ibuprofen Unknown (comments)   ??? Imitrex [Sumatriptan Succinate] Other (comments)     Tachycardia, rash sob swelling    ??? Lipitor [Atorvastatin] Unknown (comments)   ??? Milk Containing Products Unknown (comments)   ??? Tomato Other (comments)     headache   ??? Tramadol Hives   ??? Vicodin [Hydrocodone-Acetaminophen] Nausea and Vomiting     diarrhea         REVIEW OF SYSTEMS     ENDOCRINE AND ENT system  was reviewed and was negative.    PHYSICAL EXAM     Blood pressure 105/76, pulse (!) 57, weight 194 lb 11.2 oz (88.3 kg).    Pleasant female in no apparent distress  Head:  Normal  Patient States that she had a normal foot exam with PCP in early July.  Limited exam because of COVID-19      LABS/IMAGING     The following labs were reviewed in this visit with the patient:      Office Visit on 01/20/2019   Component Date Value Ref Range Status   ??? Hemoglobin A1c, External 01/01/2019 6.8   Final   Office Visit on 08/20/2018   Component Date Value Ref Range Status   ??? Hemoglobin A1c (POC) 08/20/2018 7.9  % Final     A1c 7.1% 06/10/18  Cholesterol 149  TG 82  HDL 54  LDL 70  ??  06/22/2018  Calcium 9.6  Creatinine 0.83  GFR >60  LFTs:  Normal  Microalbumin/creatinine:  Less than 7.2    Denies any recent new diagnosis/hospitalization or new medication.    ASSESSMENT AND PLAN:    Diagnoses and all orders for this visit:    1. Type 2 diabetes mellitus without complication, with long-term current use of insulin ([facility])  -     HEMOGLOBIN A1C WITH EAG; Future    2. Excessive sweating    Patient is a 53 year female being seen for follow-up of type 2 diabetes.  Is doing well.  Last A1c was 6.8% from 7.9% PREVIOUSLY.  She continues to have occasional low blood sugars.  Routine health maintenance up-to-date.  She does not have any known complications of diabetes  Blood glucose monitoring fasting, occasional pre meals and 2 hours after.  Plan  Stop the Amaryl  Continue with Lantus 20 units in the morning and 5 units at night.  If patient continues to have low blood sugars, can decrease the Lantus by 2 units at night.   Continue with metformin 1000 mg twice daily.  Blood glucose monitoring fasting, occasional pre meals and 2 hours after.  Maintain glucose.  A1c in 3 months.  If A1c continues to be good, we can consider starting her on low-dose jardiance and decrease the dose of the Lantus.  Side effects including or UTI/can do fungal infection was discussed.  Follow-up in 6 months.  Patient is complaining of increased sweating.  This started of to increase and the dose of Prozac.  Dose was decreased but sweating continued.  I have asked the patient to touch base with the PCP to  see if he can switch her to a different medication for her mood to see if sweating improves.  If it does not, I will do workup for sweating.  Patient will send me an e-mail with an update.  Spent 30 mins with the patient, greater than 50% counseling the patient regarding diet, activity, blood sugar monitoring, medication management, hypoglycemia symptoms and management.     Josetta Huddle, MD  01/20/2019    Copy of this note will be faxed to the referring provider.

## 2019-01-20 NOTE — Progress Notes (Signed)
[facility]   [place] [id number]  [phone]        Last Office visit:   10/21/2018       Endocrinology History:    Victoria Sweeney is a 53 y.o. female being seen for follow-up of type 2 diabetes.    Patient was diagnosed  with diabetes in 2014.Started on insulin around 2018.  She has been exercising in the interim.  Lost 5 lb.  She is on the lower dose of glimepiride.  Her lows have improved but she continues to have some lows.  She is also complaining of increased sweats.  This started the increase in the dose of the Prozac.  The dose was lowered but she continues to have increased sweating.  ??  Current Diabetic regimen includes:  ??  Metformin 1000 mg twice daily  Amaryl 2 mg daily  Now on Lantus 20 units in the morning and 5 units at night.      SMBG  Frequency: checks 1-3  times a day  Fingerstick range:   Pre Breakfast:  168, 61, 106, 89, 106, 129, 110, 117, 108, 118, 96, 86, 128, 132, 64, 80  2 hours after Lunch:  139, 160, 68, 156, 66, 135  Bedtime, 2 to 3 hours after dinner:  65, 178, 161, 147, 105, 100, 186, 91, 129,   ??  Hypoglycemia:   Lowest Blood sugar:  Lowest 66  Gets symptomatic, patient knows how to treat low blood sugars  ??  Routine health maintenance up-to-date.  No known complications of diabetes.              Denies any recent new diagnosis/hospitalization or new medication.    Past Medical History:   Diagnosis Date   ??? Asplenia    ??? DM (diabetes mellitus) ([place])    ??? GAD (generalized anxiety disorder)    ??? H/O esophageal reflux    ??? HTN (hypertension)    ??? IBS (irritable bowel syndrome)    ??? 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 Mixed hyperlipidemia    ??? Obesity    ??? Trochanteric bursitis, left hip    ??? Urticaria        Past Surgical History:   Procedure Laterality Date   ??? HX CHOLECYSTECTOMY     ??? HX SPLENECTOMY     ??? HX THYROIDECTOMY     ??? HX TONSILLECTOMY         History reviewed. No pertinent family history.    Social History     Socioeconomic  History   ??? Marital status: SINGLE     Spouse name: Not on file   ??? Number of children: Not on file   ??? Years of education: Not on file   ??? Highest education level: Not on file   Occupational History   ??? Not on file   Social Needs   ??? Financial resource strain: Not on file   ??? Food insecurity     Worry: Not on file     Inability: Not on file   ??? Transportation needs     Medical: Not on file     Non-medical: Not on file   Tobacco Use   ??? Smoking status: Never Smoker   ??? Smokeless tobacco: Never Used   Substance and Sexual Activity   ??? Alcohol use: Yes     Frequency: Monthly or less   ??? Drug use: Never   ??? Sexual activity:  Not on file   Lifestyle   ??? Physical activity     Days per week: Not on file     Minutes per session: Not on file   ??? Stress: Not on file   Relationships   ??? Social Product manager on phone: Not on file     Gets together: Not on file     Attends religious service: Not on file     Active member of club or organization: Not on file     Attends meetings of clubs or organizations: Not on file     Relationship status: Not on file   ??? Intimate partner violence     Fear of current or ex partner: Not on file     Emotionally abused: Not on file     Physically abused: Not on file     Forced sexual activity: Not on file   Other Topics Concern   ??? Not on file   Social History Narrative   ??? Not on file         Current Outpatient Medications:   ???  multivitamin (ONE A DAY) tablet, Take 1 Tab by mouth daily., Disp: , Rfl:   ???  insulin glargine (LANTUS,BASAGLAR) 100 unit/mL (3 mL) inpn, Inject 20-25 units am and 5-10 units pm, Disp: 60 mL, Rfl: 3  ???  glimepiride (AMARYL) 2 mg tablet, Take 1 Tab by mouth every morning., Disp: 90 Tab, Rfl: 3  ???  metFORMIN (GLUCOPHAGE) 1,000 mg tablet, Take 1 Tab by mouth two (2) times a day., Disp: 180 Tab, Rfl: 3  ???  lisinopril (PRINIVIL, ZESTRIL) 2.5 mg tablet, , Disp: , Rfl:   ???  flash glucose sensor (FREESTYLE LIBRE 14 DAY SENSOR) kit, Use to monitor glucose levels. Wear for  14 days, then replace., Disp: 2 Kit, Rfl: 11  ???  Lancing Device with Lancets (ACCU-CHEK FASTCLIX LANCING DEV) kit, Use twice daily, Disp: , Rfl:   ???  glucose blood VI test strips (ACCU-CHEK SMARTVIEW TEST STRIP) strip, Take 2 strips every day by miscell. route as needed., Disp: , Rfl:   ???  Insulin Needles, Disposable, (HEALTHY ACCENTS UNIFINE PENTIP) 31 gauge x 5/16" ndle, Healthy Accents Unifine Pentip 31 gauge x 5/16" needle, Disp: , Rfl:   ???  magnesium 250 mg tab, magnesium, Disp: , Rfl:   ???  diazePAM (VALIUM) 10 mg tablet, diazepam 10 mg tablet, Disp: , Rfl:   ???  FLUoxetine (PROZAC) 20 mg capsule, , Disp: , Rfl:   ???  ONETOUCH DELICA PLUS LANCET 33 gauge misc, , Disp: , Rfl:   ???  levothyroxine (SYNTHROID) 100 mcg tablet, Patient taking 163mg, Disp: , Rfl:   ???  pantoprazole (PROTONIX) 40 mg tablet, TAKE ONE TABLET BY MOUTH EVERY MORNING, Disp: , Rfl:   ???  prochlorperazine (COMPAZINE) 10 mg tablet, Take 1 tablet 3 times a day by oral route as needed for 10 days., Disp: , Rfl:   ???  propranolol LA (INDERAL LA) 120 mg SR capsule, , Disp: , Rfl:   ???  rosuvastatin (CRESTOR) 20 mg tablet, , Disp: , Rfl:   ???  cholecalciferol (VITAMIN D3) (1000 Units /25 mcg) tablet, Take  by mouth daily., Disp: , Rfl:     There are no discontinued medications.    Allergies   Allergen Reactions   ??? Other Food Unknown (comments)     DHE-45   ??? Codeine Shortness of Breath     Hives, tachycardia   ???  Contrast Agent [Iodine] Shortness of Breath     Sob, nv, diarrhea,hypotension   ??? Morphine Other (comments)     Hypotension, sob   ??? Amoxicillin-Pot Clavulanate Nausea and Vomiting     diarrhea   ??? Ceftin [Cefuroxime Axetil] Unknown (comments)   ??? Chocolate Flavor Unknown (comments)   ??? Ergotamine Nausea and Vomiting   ??? Erythromycin Nausea and Vomiting   ??? Hydrocodone-Ibuprofen Unknown (comments)   ??? Imitrex [Sumatriptan Succinate] Other (comments)     Tachycardia, rash sob swelling   ??? Lipitor [Atorvastatin] Unknown (comments)   ??? Milk  Containing Products Unknown (comments)   ??? Tomato Other (comments)     headache   ??? Tramadol Hives   ??? Vicodin [Hydrocodone-Acetaminophen] Nausea and Vomiting     diarrhea         REVIEW OF SYSTEMS     ENDOCRINE AND ENT system  was reviewed and was negative.    PHYSICAL EXAM     Blood pressure 105/76, pulse (!) 57, weight 194 lb 11.2 oz (88.3 kg).    Pleasant female in no apparent distress  Head:  Normal  Patient States that she had a normal foot exam with PCP in early July.  Limited exam because of COVID-19      LABS/IMAGING     The following labs were reviewed in this visit with the patient:      Office Visit on 01/20/2019   Component Date Value Ref Range Status   ??? Hemoglobin A1c, External 01/01/2019 6.8   Final   Office Visit on 08/20/2018   Component Date Value Ref Range Status   ??? Hemoglobin A1c (POC) 08/20/2018 7.9  % Final     A1c 7.1% 06/10/18  Cholesterol 149  TG 82  HDL 54  LDL 70  ??  06/22/2018  Calcium 9.6  Creatinine 0.83  GFR >60  LFTs:  Normal  Microalbumin/creatinine:  Less than 7.2    Denies any recent new diagnosis/hospitalization or new medication.    ASSESSMENT AND PLAN:    Diagnoses and all orders for this visit:    1. Type 2 diabetes mellitus without complication, with long-term current use of insulin ([facility])  -     HEMOGLOBIN A1C WITH EAG; Future    2. Excessive sweating    Patient is a 53 year female being seen for follow-up of type 2 diabetes.  Is doing well.  Last A1c was 6.8% from 7.9% PREVIOUSLY.  She continues to have occasional low blood sugars.  Routine health maintenance up-to-date.  She does not have any known complications of diabetes  Blood glucose monitoring fasting, occasional pre meals and 2 hours after.  Plan  Stop the Amaryl  Continue with Lantus 20 units in the morning and 5 units at night.  If patient continues to have low blood sugars, can decrease the Lantus by 2 units at night.  Continue with metformin 1000 mg twice daily.  Blood glucose monitoring fasting, occasional pre  meals and 2 hours after.  Maintain glucose.  A1c in 3 months.  If A1c continues to be good, we can consider starting her on low-dose jardiance and decrease the dose of the Lantus.  Side effects including or UTI/can do fungal infection was discussed.  Follow-up in 6 months.  Patient is complaining of increased sweating.  This started of to increase and the dose of Prozac.  Dose was decreased but sweating continued.  I have asked the patient to touch base with the PCP to  see if he can switch her to a different medication for her mood to see if sweating improves.  If it does not, I will do workup for sweating.  Patient will send me an e-mail with an update.  Spent 30 mins with the patient, greater than 50% counseling the patient regarding diet, activity, blood sugar monitoring, medication management, hypoglycemia symptoms and management.     Josetta Huddle, MD  01/20/2019    Copy of this note will be faxed to the referring provider.

## 2019-03-07 NOTE — Telephone Encounter (Signed)
YES PLS

## 2019-03-07 NOTE — Telephone Encounter (Signed)
Please schedule the patient for phone visit.  She prefers it after 330, might have to be on if Friday.  Not urgent

## 2019-03-07 NOTE — Telephone Encounter (Signed)
Appointment scheduled, letter mailed

## 2019-04-21 ENCOUNTER — Telehealth

## 2019-04-21 NOTE — Telephone Encounter (Signed)
Please meal a lab slip for A1c.

## 2019-04-21 NOTE — Telephone Encounter (Signed)
Done

## 2019-05-08 ENCOUNTER — Encounter

## 2019-05-09 ENCOUNTER — Telehealth: Admit: 2019-05-09 | Payer: PRIVATE HEALTH INSURANCE | Attending: "Endocrinology | Primary: Family Medicine

## 2019-05-09 ENCOUNTER — Telehealth

## 2019-05-09 ENCOUNTER — Telehealth: Attending: "Endocrinology | Primary: Family Medicine

## 2019-05-09 DIAGNOSIS — E119 Type 2 diabetes mellitus without complications: Secondary | ICD-10-CM

## 2019-05-09 NOTE — Progress Notes (Signed)
[facility]   [place] [phone]  [phone]      TELEMEDICINE VISIT (Performed via Telephone or Audio & Video)     METHOD OF VISIT: VV Type: Audio only (Telephone). VV Audio Only Reason: Patient Preference  PROVIDER LOCATION:  [facility] Tele[facility] Provider Location: Office  PATIENT PHYSICAL ADDRESS AT TIME OF TELEMEDICINE VISIT: Home: [place] [id number]   PARTICIPANTS: Patient  BILLING CODE:  Tele-Health (Audio / Video) Visit:  Billing for this visit is time based - 99214 - Est. - 25-39 minutes   TOTAL TIME SPENT ON ENCOUNTER:  30 minutes      ? Recent results reviewed in the electronic health record.  o I specifically reviewed the following: Imaging, Labs and Previous Office Encounters  ? Problems, Meds and Allergies were reviewed.  ? Patient gave verbal consent for this TeleHealth visit.        Last Office visit:   01/20/2019     Endocrinology History:    Victoria Sweeney is a 53 y.o. female being seen for follow-up.  I see patient for type 2 diabetes.  However, this visit was made her sweating.  At the last visit, patient mention that she had increased sweats after increasing the dose of the Prozac.  She subsequently lower the dose and came off the Prozac completely.  Patient states that her sweating has improved significantly.  At this point, she does not want any additional workup.    Patient was diagnosed  with diabetes in??2014.Started on insulin??around 2018. Amaryl was discontinued at the last visit because of low blood sugars.  Unable to do jardiance because of history of UTIs.  There is no history of pancreatitis  ??  Current Diabetic regimen includes:  Metformin??1000 mg twice daily  Lantus??25 units in the morning and 7 units at night  ??  SMBG has continues glucose monitoring  Frequency: checks??1-3????times a day  Fingerstick range:   Pre Breakfast:??120  Lunch:  170  NIGHT 90    Hypoglycemia:??BEDTIME 70  Gets symptomatic, patient knows how to treat low blood sugars     Routine health maintenance up-to-date.  No known complications of diabetes.  EYE EXAM July 2020  FEET OK  Flu shot:yes  Denies any recent new diagnosis/hospitalization or new medication.    Past Medical History:   Diagnosis Date   ??? Asplenia    ??? DM (diabetes mellitus) ([place])    ??? GAD (generalized anxiety disorder)    ??? H/O esophageal reflux    ??? HTN (hypertension)    ??? IBS (irritable bowel syndrome)    ??? 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 Mixed hyperlipidemia    ??? Obesity    ??? Trochanteric bursitis, left hip    ??? Urticaria        Past Surgical History:   Procedure Laterality Date   ??? HX CHOLECYSTECTOMY     ??? HX SPLENECTOMY     ??? HX THYROIDECTOMY     ??? HX TONSILLECTOMY         History reviewed. No pertinent family history.    Social History     Socioeconomic History   ??? Marital status: SINGLE     Spouse name: Not on file   ??? Number of children: Not on file   ??? Years of education: Not on file   ??? Highest education level: Not on file   Occupational History   ??? Not on file  Social Needs   ??? Financial resource strain: Not on file   ??? Food insecurity     Worry: Not on file     Inability: Not on file   ??? Transportation needs     Medical: Not on file     Non-medical: Not on file   Tobacco Use   ??? Smoking status: Never Smoker   ??? Smokeless tobacco: Never Used   Substance and Sexual Activity   ??? Alcohol use: Yes     Frequency: Monthly or less   ??? Drug use: Never   ??? Sexual activity: Not on file   Lifestyle   ??? Physical activity     Days per week: Not on file     Minutes per session: Not on file   ??? Stress: Not on file   Relationships   ??? Social Product manager on phone: Not on file     Gets together: Not on file     Attends religious service: Not on file     Active member of club or organization: Not on file     Attends meetings of clubs or organizations: Not on file     Relationship status: Not on file   ??? Intimate partner violence     Fear of current or ex partner: Not on file      Emotionally abused: Not on file     Physically abused: Not on file     Forced sexual activity: Not on file   Other Topics Concern   ??? Not on file   Social History Narrative   ??? Not on file         Current Outpatient Medications:   ???  multivitamin (ONE A DAY) tablet, Take 1 Tab by mouth daily., Disp: , Rfl:   ???  insulin glargine (LANTUS,BASAGLAR) 100 unit/mL (3 mL) inpn, Inject 20-25 units am and 5-10 units pm, Disp: 60 mL, Rfl: 3  ???  metFORMIN (GLUCOPHAGE) 1,000 mg tablet, Take 1 Tab by mouth two (2) times a day., Disp: 180 Tab, Rfl: 3  ???  lisinopril (PRINIVIL, ZESTRIL) 2.5 mg tablet, , Disp: , Rfl:   ???  flash glucose sensor (FREESTYLE LIBRE 14 DAY SENSOR) kit, Use to monitor glucose levels. Wear for 14 days, then replace., Disp: 2 Kit, Rfl: 11  ???  Lancing Device with Lancets (ACCU-CHEK FASTCLIX LANCING DEV) kit, Use twice daily, Disp: , Rfl:   ???  glucose blood VI test strips (ACCU-CHEK SMARTVIEW TEST STRIP) strip, Take 2 strips every day by miscell. route as needed., Disp: , Rfl:   ???  Insulin Needles, Disposable, (HEALTHY ACCENTS UNIFINE PENTIP) 31 gauge x 5/16" ndle, Healthy Accents Unifine Pentip 31 gauge x 5/16" needle, Disp: , Rfl:   ???  magnesium 250 mg tab, magnesium, Disp: , Rfl:   ???  ONETOUCH DELICA PLUS LANCET 33 gauge misc, , Disp: , Rfl:   ???  levothyroxine (SYNTHROID) 100 mcg tablet, Patient taking 166mg, Disp: , Rfl:   ???  pantoprazole (PROTONIX) 40 mg tablet, TAKE ONE TABLET BY MOUTH EVERY MORNING, Disp: , Rfl:   ???  prochlorperazine (COMPAZINE) 10 mg tablet, Take 1 tablet 3 times a day by oral route as needed for 10 days., Disp: , Rfl:   ???  propranolol LA (INDERAL LA) 120 mg SR capsule, , Disp: , Rfl:   ???  rosuvastatin (CRESTOR) 20 mg tablet, , Disp: , Rfl:   ???  cholecalciferol (VITAMIN D3) (1000 Units /  25 mcg) tablet, Take  by mouth daily., Disp: , Rfl:   ???  albuterol (PROVENTIL HFA, VENTOLIN HFA, PROAIR HFA) 90 mcg/actuation inhaler, , Disp: , Rfl:    ???  glimepiride (AMARYL) 2 mg tablet, Take 1 Tab by mouth every morning., Disp: 90 Tab, Rfl: 3  ???  diazePAM (VALIUM) 10 mg tablet, diazepam 10 mg tablet, Disp: , Rfl:   ???  FLUoxetine (PROZAC) 20 mg capsule, , Disp: , Rfl:     There are no discontinued medications.    Allergies   Allergen Reactions   ??? Other Food Unknown (comments)     DHE-45   ??? Codeine Shortness of Breath     Hives, tachycardia   ??? Contrast Agent [Iodine] Shortness of Breath     Sob, nv, diarrhea,hypotension   ??? Morphine Other (comments)     Hypotension, sob   ??? Amoxicillin-Pot Clavulanate Nausea and Vomiting     diarrhea   ??? Ceftin [Cefuroxime Axetil] Unknown (comments)   ??? Chocolate Flavor Unknown (comments)   ??? Ergotamine Nausea and Vomiting   ??? Erythromycin Nausea and Vomiting   ??? Hydrocodone-Ibuprofen Unknown (comments)   ??? Imitrex [Sumatriptan Succinate] Other (comments)     Tachycardia, rash sob swelling   ??? Lipitor [Atorvastatin] Unknown (comments)   ??? Milk Containing Products Unknown (comments)   ??? Tomato Other (comments)     headache   ??? Tramadol Hives   ??? Vicodin [Hydrocodone-Acetaminophen] Nausea and Vomiting     diarrhea     REVIEW OF SYSTEMS     Endocrine and Neurology system was reviewed and was negative.    PHYSICAL EXAM   Not done   LABS/IMAGING     The following labs were reviewed in this visit with the patient:    A1C 7.5% (05/07/2019)    Office Visit on 01/20/2019   Component Date Value Ref Range Status   ??? Hemoglobin A1c, External 01/01/2019 6.8   Final     08/20/2018 A1C 7.9%    A1c 7.1% 06/10/18   Cholesterol 149  TG 82  HDL 54  LDL 70  ??  06/22/2018  Calcium 9.6  Creatinine 0.83  GFR??>60  LFTs: ??Normal  Microalbumin/creatinine: ??Less than 7.2      ASSESSMENT AND PLAN:    Diagnoses and all orders for this visit:    1. Type 2 diabetes mellitus without complication, with long-term current use of insulin ([place])    2. Sweating increase      Patient is a 53 year female with type 2 diabetes increased sweating.  Her  sweating has improved after stopping the Prozac.  With regards to her diabetes, her A1c today is ago was 7.5% from 6.8% previously.  Patient stop the Amaryl because of low blood sugars.  Her low blood sugars improved but blood sugars a slightly higher.  Upon review of her blood sugar it seems like patient has postprandial hyperglycemia.  We talked about doing trulicity versus Januvia.  Patient opted to do Januvia.  Side effects from nausea vomiting stomach upset pancreatitis was discussed.  Routine health maintenance up-to-date glucose monitoring CGM  No known Complications from diabetes.  Plan  Will start the patient on Januvia 100 mg daily.  Continue with metformin 1000 mg twice daily.  Continue with Lantus 25 units in the morning and 7 units at night.  Her patient will lower her insulin if she starts having low blood sugars with the Januvia.  A1c in 3 months.  Patient is  due for lipid, urine microalbumin PCP December.  Follow-up in 6 months.    Spent 30 mins with the patient, greater than 50% counseling the patient regarding diet, activity, blood sugar monitoring, medication management, hypoglycemia symptoms and management.       Josetta Huddle, MD  05/09/2019    Copy of this note will be faxed to the referring provider.

## 2019-05-09 NOTE — Telephone Encounter (Signed)
error 

## 2019-05-09 NOTE — Telephone Encounter (Signed)
Please send a script for Januvia 100 mg daily.  A1c in 3 months.

## 2019-05-09 NOTE — Telephone Encounter (Signed)
Please give her a six-month follow-up.

## 2019-05-09 NOTE — Progress Notes (Signed)
[facility]   [place] [address]  [phone]      TELEMEDICINE VISIT (Performed via Telephone or Audio & Video)     METHOD OF VISIT: VV Type: Audio only (Telephone). VV Audio Only Reason: Patient Preference  PROVIDER LOCATION:  [facility] Telehealth Provider Location: Office  PATIENT PHYSICAL ADDRESS AT TIME OF TELEMEDICINE VISIT: Home: [place] [address]   PARTICIPANTS: Patient  BILLING CODE:  Tele-Health (Audio / Video) Visit:  Billing for this visit is time based - 99214 - Est. - 25-39 minutes   TOTAL TIME SPENT ON ENCOUNTER:  30 minutes      ??? Recent results reviewed in the electronic health record.  o I specifically reviewed the following: Imaging, Labs and Previous Office Encounters  ??? Problems, Meds and Allergies were reviewed.  ??? Patient gave verbal consent for this TeleHealth visit.        Last Office visit:   01/20/2019     Endocrinology History:    Victoria Sweeney is a 53 y.o. female being seen for follow-up.  I see patient for type 2 diabetes.  However, this visit was made her sweating.  At the last visit, patient mention that she had increased sweats after increasing the dose of the Prozac.  She subsequently lower the dose and came off the Prozac completely.  Patient states that her sweating has improved significantly.  At this point, she does not want any additional workup.    Patient was diagnosed  with diabetes in??2014.Started on insulin??around 2018. Amaryl was discontinued at the last visit because of low blood sugars.  Unable to do jardiance because of history of UTIs.  There is no history of pancreatitis  ??  Current Diabetic regimen includes:  Metformin??1000 mg twice daily  Lantus??25 units in the morning and 7 units at night  ??  SMBG has continues glucose monitoring  Frequency: checks??1-3????times a day  Fingerstick range:   Pre Breakfast:??120  Lunch:  170  NIGHT 90    Hypoglycemia:??BEDTIME 70  Gets symptomatic, patient knows how to treat low blood  sugars    Routine health maintenance up-to-date.  No known complications of diabetes.  EYE EXAM July 2020  FEET OK  Flu shot:yes  Denies any recent new diagnosis/hospitalization or new medication.    Past Medical History:   Diagnosis Date   ??? Asplenia    ??? DM (diabetes mellitus) ([place])    ??? GAD (generalized anxiety disorder)    ??? H/O esophageal reflux    ??? HTN (hypertension)    ??? IBS (irritable bowel syndrome)    ??? 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 Mixed hyperlipidemia    ??? Obesity    ??? Trochanteric bursitis, left hip    ??? Urticaria        Past Surgical History:   Procedure Laterality Date   ??? HX CHOLECYSTECTOMY     ??? HX SPLENECTOMY     ??? HX THYROIDECTOMY     ??? HX TONSILLECTOMY         History reviewed. No pertinent family history.    Social History     Socioeconomic History   ??? Marital status: SINGLE     Spouse name: Not on file   ??? Number of children: Not on file   ??? Years of education: Not on file   ??? Highest education level: Not on file   Occupational History   ??? Not on file  Social Needs   ??? Financial resource strain: Not on file   ??? Food insecurity     Worry: Not on file     Inability: Not on file   ??? Transportation needs     Medical: Not on file     Non-medical: Not on file   Tobacco Use   ??? Smoking status: Never Smoker   ??? Smokeless tobacco: Never Used   Substance and Sexual Activity   ??? Alcohol use: Yes     Frequency: Monthly or less   ??? Drug use: Never   ??? Sexual activity: Not on file   Lifestyle   ??? Physical activity     Days per week: Not on file     Minutes per session: Not on file   ??? Stress: Not on file   Relationships   ??? Social Product manager on phone: Not on file     Gets together: Not on file     Attends religious service: Not on file     Active member of club or organization: Not on file     Attends meetings of clubs or organizations: Not on file     Relationship status: Not on file   ??? Intimate partner violence     Fear of current or ex partner: Not on file      Emotionally abused: Not on file     Physically abused: Not on file     Forced sexual activity: Not on file   Other Topics Concern   ??? Not on file   Social History Narrative   ??? Not on file         Current Outpatient Medications:   ???  multivitamin (ONE A DAY) tablet, Take 1 Tab by mouth daily., Disp: , Rfl:   ???  insulin glargine (LANTUS,BASAGLAR) 100 unit/mL (3 mL) inpn, Inject 20-25 units am and 5-10 units pm, Disp: 60 mL, Rfl: 3  ???  metFORMIN (GLUCOPHAGE) 1,000 mg tablet, Take 1 Tab by mouth two (2) times a day., Disp: 180 Tab, Rfl: 3  ???  lisinopril (PRINIVIL, ZESTRIL) 2.5 mg tablet, , Disp: , Rfl:   ???  flash glucose sensor (FREESTYLE LIBRE 14 DAY SENSOR) kit, Use to monitor glucose levels. Wear for 14 days, then replace., Disp: 2 Kit, Rfl: 11  ???  Lancing Device with Lancets (ACCU-CHEK FASTCLIX LANCING DEV) kit, Use twice daily, Disp: , Rfl:   ???  glucose blood VI test strips (ACCU-CHEK SMARTVIEW TEST STRIP) strip, Take 2 strips every day by miscell. route as needed., Disp: , Rfl:   ???  Insulin Needles, Disposable, (HEALTHY ACCENTS UNIFINE PENTIP) 31 gauge x 5/16" ndle, Healthy Accents Unifine Pentip 31 gauge x 5/16" needle, Disp: , Rfl:   ???  magnesium 250 mg tab, magnesium, Disp: , Rfl:   ???  ONETOUCH DELICA PLUS LANCET 33 gauge misc, , Disp: , Rfl:   ???  levothyroxine (SYNTHROID) 100 mcg tablet, Patient taking 147mg, Disp: , Rfl:   ???  pantoprazole (PROTONIX) 40 mg tablet, TAKE ONE TABLET BY MOUTH EVERY MORNING, Disp: , Rfl:   ???  prochlorperazine (COMPAZINE) 10 mg tablet, Take 1 tablet 3 times a day by oral route as needed for 10 days., Disp: , Rfl:   ???  propranolol LA (INDERAL LA) 120 mg SR capsule, , Disp: , Rfl:   ???  rosuvastatin (CRESTOR) 20 mg tablet, , Disp: , Rfl:   ???  cholecalciferol (VITAMIN D3) (1000 Units /  25 mcg) tablet, Take  by mouth daily., Disp: , Rfl:   ???  albuterol (PROVENTIL HFA, VENTOLIN HFA, PROAIR HFA) 90 mcg/actuation inhaler, , Disp: , Rfl:   ???  glimepiride (AMARYL) 2 mg tablet, Take 1 Tab by  mouth every morning., Disp: 90 Tab, Rfl: 3  ???  diazePAM (VALIUM) 10 mg tablet, diazepam 10 mg tablet, Disp: , Rfl:   ???  FLUoxetine (PROZAC) 20 mg capsule, , Disp: , Rfl:     There are no discontinued medications.    Allergies   Allergen Reactions   ??? Other Food Unknown (comments)     DHE-45   ??? Codeine Shortness of Breath     Hives, tachycardia   ??? Contrast Agent [Iodine] Shortness of Breath     Sob, nv, diarrhea,hypotension   ??? Morphine Other (comments)     Hypotension, sob   ??? Amoxicillin-Pot Clavulanate Nausea and Vomiting     diarrhea   ??? Ceftin [Cefuroxime Axetil] Unknown (comments)   ??? Chocolate Flavor Unknown (comments)   ??? Ergotamine Nausea and Vomiting   ??? Erythromycin Nausea and Vomiting   ??? Hydrocodone-Ibuprofen Unknown (comments)   ??? Imitrex [Sumatriptan Succinate] Other (comments)     Tachycardia, rash sob swelling   ??? Lipitor [Atorvastatin] Unknown (comments)   ??? Milk Containing Products Unknown (comments)   ??? Tomato Other (comments)     headache   ??? Tramadol Hives   ??? Vicodin [Hydrocodone-Acetaminophen] Nausea and Vomiting     diarrhea     REVIEW OF SYSTEMS     Endocrine and Neurology system was reviewed and was negative.    PHYSICAL EXAM   Not done   LABS/IMAGING     The following labs were reviewed in this visit with the patient:    A1C 7.5% (05/07/2019)    Office Visit on 01/20/2019   Component Date Value Ref Range Status   ??? Hemoglobin A1c, External 01/01/2019 6.8   Final     08/20/2018 A1C 7.9%    A1c 7.1% 06/10/18   Cholesterol 149  TG 82  HDL 54  LDL 70  ??  06/22/2018  Calcium 9.6  Creatinine 0.83  GFR??>60  LFTs: ??Normal  Microalbumin/creatinine: ??Less than 7.2      ASSESSMENT AND PLAN:    Diagnoses and all orders for this visit:    1. Type 2 diabetes mellitus without complication, with long-term current use of insulin ([place])    2. Sweating increase      Patient is a 53 year female with type 2 diabetes increased sweating.  Her sweating has improved after stopping the Prozac.  With regards to her  diabetes, her A1c today is ago was 7.5% from 6.8% previously.  Patient stop the Amaryl because of low blood sugars.  Her low blood sugars improved but blood sugars a slightly higher.  Upon review of her blood sugar it seems like patient has postprandial hyperglycemia.  We talked about doing trulicity versus Januvia.  Patient opted to do Januvia.  Side effects from nausea vomiting stomach upset pancreatitis was discussed.  Routine health maintenance up-to-date glucose monitoring CGM  No known Complications from diabetes.  Plan  Will start the patient on Januvia 100 mg daily.  Continue with metformin 1000 mg twice daily.  Continue with Lantus 25 units in the morning and 7 units at night.  Her patient will lower her insulin if she starts having low blood sugars with the Januvia.  A1c in 3 months.  Patient is  due for lipid, urine microalbumin PCP December.  Follow-up in 6 months.    Spent 30 mins with the patient, greater than 50% counseling the patient regarding diet, activity, blood sugar monitoring, medication management, hypoglycemia symptoms and management.       Josetta Huddle, MD  05/09/2019    Copy of this note will be faxed to the referring provider.

## 2019-05-12 MED ORDER — SITAGLIPTIN 100 MG TAB
100 mg | ORAL_TABLET | Freq: Every day | ORAL | 3 refills | Status: DC
Start: 2019-05-12 — End: 2020-06-18

## 2019-05-12 NOTE — Addendum Note (Signed)
Addended by: Elige Ko on: 05/12/2019 08:51 AM     Modules accepted: Orders

## 2019-05-12 NOTE — Telephone Encounter (Signed)
Done

## 2019-05-12 NOTE — Telephone Encounter (Signed)
Scheduled and letter mailed to patient

## 2019-05-12 NOTE — Addendum Note (Signed)
Addendum Note by Elige Ko, CMA at 05/12/19 0865                Author: Elige Ko, CMA  Service: --  Author Type: Medical Assistant       Filed: 05/12/19 0851  Encounter Date: 05/09/2019  Status: Signed          Editor: Elige Ko, CMA (Medical Assistant)          Addended by: Elige Ko on: 05/12/2019 08:51 AM    Modules accepted: Orders

## 2019-07-23 NOTE — Telephone Encounter (Signed)
Victoria Sweeney called in wanting to change her appointment on 1/22 to virtual. Looked at appointment looks like was rescheduled for 11/11/19 at 11:30. Called her back to let her know.

## 2019-07-23 NOTE — Telephone Encounter (Signed)
Noted.

## 2019-07-25 ENCOUNTER — Encounter: Attending: "Endocrinology | Primary: Family Medicine

## 2019-08-28 ENCOUNTER — Telehealth

## 2019-08-28 NOTE — Telephone Encounter (Signed)
Done.

## 2019-08-28 NOTE — Telephone Encounter (Signed)
-----   Message from Elige Ko, [place] sent at 08/27/2019  7:47 AM EST -----  Regarding: FW: Non-Urgent Medical Question  Contact: [phone]    ----- Message -----  From: Zenaida Deed Marteney  Sent: 08/26/2019   7:19 PM EST  To: Keitha Butte  Subject: Non-Urgent Medical Question                      I received a letter today (Tuesday) from the office of Dr. Dwaine Gale asking me to have some lab work done but no lab slip was attached.    Please forward the lab slips with directions about whether I need to fast or not.    Sincerely,  Vicente Masson

## 2019-08-28 NOTE — Telephone Encounter (Signed)
pls mail lab slip for A1C, fasting not Needed.

## 2019-09-15 LAB — AMB EXT HGBA1C
Hemoglobin A1C, External: 6.7 % — AB (ref 4.5–5.7)
Hemoglobin A1c, External: 6.7 % — AB (ref 4.5–5.7)

## 2019-10-20 NOTE — Telephone Encounter (Signed)
PT LVM to RS. 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 She said late June early July would work better.   Was able to RS for 6/28 at 1145 but unable to reach her as the phone # she gave and that is on file [phone] is not in service.

## 2019-11-11 ENCOUNTER — Encounter: Payer: PRIVATE HEALTH INSURANCE | Attending: "Endocrinology | Primary: Family Medicine

## 2019-11-12 DIAGNOSIS — H8111 Benign paroxysmal vertigo, right ear: Secondary | ICD-10-CM

## 2019-11-12 DIAGNOSIS — R413 Other amnesia: Secondary | ICD-10-CM | POA: Insufficient documentation

## 2019-11-12 DIAGNOSIS — E785 Hyperlipidemia, unspecified: Secondary | ICD-10-CM

## 2019-11-12 DIAGNOSIS — N951 Menopausal and female climacteric states: Secondary | ICD-10-CM

## 2019-11-12 HISTORY — DX: Menopausal and female climacteric states: N95.1

## 2019-11-12 HISTORY — DX: Hyperlipidemia, unspecified: E78.5

## 2019-11-12 HISTORY — DX: Other amnesia: R41.3

## 2019-11-12 HISTORY — DX: Benign paroxysmal vertigo, right ear: H81.11

## 2019-12-29 ENCOUNTER — Ambulatory Visit
Admit: 2019-12-29 | Discharge: 2019-12-29 | Payer: PRIVATE HEALTH INSURANCE | Attending: "Endocrinology | Primary: Family Medicine

## 2019-12-29 ENCOUNTER — Ambulatory Visit: Attending: "Endocrinology | Primary: Family Medicine

## 2019-12-29 DIAGNOSIS — E119 Type 2 diabetes mellitus without complications: Secondary | ICD-10-CM

## 2019-12-29 LAB — AMB POC HEMOGLOBIN A1C
Hemoglobin A1C, POC: 6.9 % — AB (ref 4.0–6.0)
Hemoglobin A1c (POC): 6.9 % — AB (ref 4.0–6.0)

## 2019-12-29 NOTE — Addendum Note (Signed)
Addendum  Note by Winfield Rast R at 12/29/19 1200                Author: Winfield Rast R  Service: --  Author Type: Medical Assistant       Filed: 01/12/20 1324  Encounter Date: 12/29/2019  Status: Signed          Editor: Haywood Lasso (Medical Assistant)          Addended by: Haywood Lasso on: 01/12/2020 01:24 PM    Modules accepted: Orders

## 2019-12-29 NOTE — Progress Notes (Signed)
[facility]   [place] [address]  [phone]        Last Office visit:   05/09/2019     Endocrinology History:    Arneshia Ade Sebald is a 54 y.o. female being seen for follow-up of type 2 diabetes.  Patient was diagnosed with diabetes around 2014 and started on insulin around 2018. She has history of recurrent UTIs.  Patient is off the Amaryl.  Vania Rea was started on 6 months ago.  She is doing well on it.  Lately is having heart burn because of some dietary changes.  Protonix.  Denies any abdominal pain nausea vomiting.  Patient lost 21 lb in 1 year.  States that she is eating more healthy, walking more.  Feels good.  She is complaining of shoulder pain which migraines to the other shoulder.  Is now having bilateral hip pain.   ??  Current Diabetic regimen includes:  Metformin??1000 mg twice daily  Januvia 100 mg daily  Lantus??20- 25 units in the morning and??stopped the evening dose 7??units at night  Recently increase the morning dose to 25 units because of steroid injection in her shoulders.  ??  SMBG has Free style Libre   Frequency:   Fingerstick range:   Target range: 86%  High: 14%  NO LOWS  AVG: 145   ??  Hypoglycemia:??No   Gets symptomatic, patient knows how to treat low blood sugars  ??  Routine health maintenance up-to-date. ??No known complications of diabetes.  EYE EXAM:  July 2020  FEET OK  Flu shot:yes  COVID 19 Vaccine:yes     Denies any recent new diagnosis/hospitalization or new medication.    Past Medical History:   Diagnosis Date   ??? Asplenia    ??? DM (diabetes mellitus) ([place])    ??? GAD (generalized anxiety disorder)    ??? H/O esophageal reflux    ??? HTN (hypertension)    ??? IBS (irritable bowel syndrome)    ??? 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 Mixed hyperlipidemia    ??? Obesity    ??? Trochanteric bursitis, left hip    ??? Urticaria        Past Surgical History:   Procedure Laterality Date   ??? HX CHOLECYSTECTOMY     ??? HX SPLENECTOMY     ??? HX THYROIDECTOMY     ??? HX  TONSILLECTOMY         History reviewed. No pertinent family history.    Social History     Socioeconomic History   ??? Marital status: SINGLE     Spouse name: Not on file   ??? Number of children: Not on file   ??? Years of education: Not on file   ??? Highest education level: Not on file   Occupational History   ??? Not on file   Tobacco Use   ??? Smoking status: Never Smoker   ??? Smokeless tobacco: Never Used   Vaping Use   ??? Vaping Use: Never used   Substance and Sexual Activity   ??? Alcohol use: Yes   ??? Drug use: Never   ??? Sexual activity: Not on file   Other Topics Concern   ??? Not on file   Social History Narrative   ??? Not on file     Social Determinants of Health     Financial Resource Strain:    ??? Difficulty of Paying Living Expenses:    Food Insecurity:    ???  Worried About Charity fundraiser in the Last Year:    ??? Arboriculturist in the Last Year:    Transportation Needs:    ??? Film/video editor (Medical):    ??? Lack of Transportation (Non-Medical):    Physical Activity:    ??? Days of Exercise per Week:    ??? Minutes of Exercise per Session:    Stress:    ??? Feeling of Stress :    Social Connections:    ??? Frequency of Communication with Friends and Family:    ??? Frequency of Social Gatherings with Friends and Family:    ??? Attends Religious Services:    ??? Marine scientist or Organizations:    ??? Attends Music therapist:    ??? Marital Status:    Intimate Production manager Violence:    ??? Fear of Current or Ex-Partner:    ??? Emotionally Abused:    ??? Physically Abused:    ??? Sexually Abused:          Current Outpatient Medications:   ???  oxybutynin chloride XL (DITROPAN XL) 5 mg CR tablet, , Disp: , Rfl:   ???  losartan (COZAAR) 100 mg tablet, , Disp: , Rfl:   ???  SITagliptin (JANUVIA) 100 mg tablet, Take 1 Tab by mouth daily., Disp: 90 Tab, Rfl: 3  ???  albuterol (PROVENTIL HFA, VENTOLIN HFA, PROAIR HFA) 90 mcg/actuation inhaler, , Disp: , Rfl:   ???  BD Ultra-Fine Mini Pen Needle 31 gauge x 3/16" ndle, USE FOR INJECTING INSULIN  BID, Disp: , Rfl:   ???  multivitamin (ONE A DAY) tablet, Take 1 Tab by mouth daily., Disp: , Rfl:   ???  metFORMIN (GLUCOPHAGE) 1,000 mg tablet, Take 1 Tab by mouth two (2) times a day., Disp: 180 Tab, Rfl: 3  ???  flash glucose sensor (FREESTYLE LIBRE 14 DAY SENSOR) kit, Use to monitor glucose levels. Wear for 14 days, then replace., Disp: 2 Kit, Rfl: 11  ???  Lancing Device with Lancets (ACCU-CHEK FASTCLIX LANCING DEV) kit, Use twice daily, Disp: , Rfl:   ???  glucose blood VI test strips (ACCU-CHEK SMARTVIEW TEST STRIP) strip, Take 2 strips every day by miscell. route as needed., Disp: , Rfl:   ???  Insulin Needles, Disposable, (HEALTHY ACCENTS UNIFINE PENTIP) 31 gauge x 5/16" ndle, Healthy Accents Unifine Pentip 31 gauge x 5/16" needle, Disp: , Rfl:   ???  magnesium 250 mg tab, magnesium, Disp: , Rfl:   ???  FLUoxetine (PROZAC) 20 mg capsule, , Disp: , Rfl:   ???  ONETOUCH DELICA PLUS LANCET 33 gauge misc, , Disp: , Rfl:   ???  levothyroxine (SYNTHROID) 100 mcg tablet, Patient taking 173mg, Disp: , Rfl:   ???  pantoprazole (PROTONIX) 40 mg tablet, TAKE ONE TABLET BY MOUTH EVERY MORNING, Disp: , Rfl:   ???  prochlorperazine (COMPAZINE) 10 mg tablet, Take 1 tablet 3 times a day by oral route as needed for 10 days., Disp: , Rfl:   ???  propranolol LA (INDERAL LA) 120 mg SR capsule, , Disp: , Rfl:   ???  rosuvastatin (CRESTOR) 20 mg tablet, , Disp: , Rfl:   ???  cholecalciferol (VITAMIN D3) (1000 Units /25 mcg) tablet, Take  by mouth daily., Disp: , Rfl:   ???  insulin glargine (LANTUS,BASAGLAR) 100 unit/mL (3 mL) inpn, Inject 20-25 units am and 5-10 units pm (Patient taking differently: Inject 20-25 units am), Disp: 60 mL, Rfl: 3  ???  diazePAM (VALIUM) 10 mg tablet, diazepam 10 mg tablet (Patient not taking: Reported on 12/29/2019), Disp: , Rfl:     Medications Discontinued During This Encounter   Medication Reason   ??? glimepiride (AMARYL) 2 mg tablet Therapy Completed   ??? lisinopril (PRINIVIL, ZESTRIL) 2.5 mg tablet Therapy Completed        Allergies   Allergen Reactions   ??? Other Food Unknown (comments)     DHE-45   ??? Codeine Shortness of Breath     Hives, tachycardia   ??? Contrast Agent [Iodine] Shortness of Breath     Sob, nv, diarrhea,hypotension   ??? Morphine Other (comments)     Hypotension, sob   ??? Amoxicillin-Pot Clavulanate Nausea and Vomiting     diarrhea   ??? Ceftin [Cefuroxime Axetil] Unknown (comments)   ??? Chocolate Flavor Unknown (comments)   ??? Ergotamine Nausea and Vomiting   ??? Erythromycin Nausea and Vomiting   ??? Hydrocodone-Ibuprofen Unknown (comments)   ??? Imitrex [Sumatriptan Succinate] Other (comments)     Tachycardia, rash sob swelling   ??? Lipitor [Atorvastatin] Unknown (comments)   ??? Milk Containing Products Unknown (comments)   ??? Tomato Other (comments)     headache   ??? Tramadol Hives   ??? Vicodin [Hydrocodone-Acetaminophen] Nausea and Vomiting     diarrhea         REVIEW OF SYSTEMS     Endocrine and neurology system  was reviewed and was negative.    PHYSICAL EXAM     Vitals:    12/29/19 1209   BP: 98/68   Pulse: 70   SpO2: 98%   Weight: 173 lb 3.2 oz (78.6 kg)     Body mass index is 28.82 kg/m??.  Wt Readings from Last 3 Encounters:   12/29/19 173 lb 3.2 oz (78.6 kg)   01/20/19 194 lb 11.2 oz (88.3 kg)   08/20/18 199 lb 8 oz (90.5 kg)       Physical Exam    Pleasant female in no apparent distress  Head:  Normal  Eyes:  Extraocular movements full  Neck:  Supple no thyroid enlargement  Chest:  Bilaterally clear to auscultation  CVS:  S1-S2 heard regular  Abdomen:  Soft nontender nondistended bowel sounds present  Extremities:   No edema  Dorsalis pedis palpable  Intact sensation on 10 G monofilament testing on the dorsum of the feet       LABS/IMAGING     The following labs were reviewed in this visit with the patient:  10/22/2019  EGFR (MDRD) a >60   05/2019  Cholesterol  147 112-199 (mg/dL)   Triglyceride [Mass/volume] in Serum or Plasma a 81 (mg/dL)  Cholesterol in HDL [Mass/volume] in Serum or Plasma b 55 40-100 (mg/dL)     Cholesterol in LDL [Mass/volume] in Serum or Plasma by calculation 76 0-99 (mg/dL)    Cholesterol non HDL [Mass/volume] in Serum or Plasma 92.0 0.0-129.0 (mg/dL)       Abstract on 09/29/2019   Component Date Value Ref Range Status   ??? Hemoglobin A1c, External 09/15/2019 6.7* 4.5 - 5.7 % Final   A1C 7.5% (05/07/2019)  ??          Office Visit on 01/20/2019   Component Date Value Ref Range Status   ??? Hemoglobin A1c, External 01/01/2019 6.8  ?? Final   ??  08/20/2018 A1C 7.9%  ??  A1c 7.1% 06/10/18   Cholesterol 149  TG 82  HDL 54  LDL 70  ??  06/22/2018  Calcium 9.6  Creatinine 0.83  GFR??>60  LFTs: ??Normal  Microalbumin/creatinine: ??Less than 7.2      ASSESSMENT AND PLAN:    Diagnoses and all orders for this visit:    1. Type 2 diabetes mellitus without complication, with long-term current use of insulin ([facility])  -     AMB POC HEMOGLOBIN A1C        Patient is a 54 year old female with type 2 diabetes.  Patient does not have any known complications of diabetes.  She is on metformin, Januvia and Lantus.  She has lost 21 lb in 1 year.  She is complaining of joint pain.  No stomach issues.  Her sweating has improved.  Blood glucose monitoring:  Freestyle libre 14 days.  Routine health maintenance up-to-date.  A1c today 6.9% from 6.3% previously.  Plan  Continue with the Januvia and metformin.  If patient has low blood sugars, would recommend lowering the morning dose of the Lantus.  Would recommend talking to PCP about the joint issues.  Follow-up in 1 year with me.    Josetta Huddle, MD  12/29/2019    Copy of this note will be faxed to the referring provider.

## 2020-01-12 LAB — AMB POC HEMOGLOBIN A1C
Hemoglobin A1C, POC: 6.9 % — AB (ref 4.0–6.0)
Hemoglobin A1c (POC): 6.9 % — AB (ref 4.0–6.0)

## 2020-01-26 NOTE — Telephone Encounter (Signed)
Patient called and left a voicemail regarding her recent high blood pressure.     Per the pateints PCP she would like a recommendation on a blood pressure medication.     Please review and contact the patient to advise.   Patient contact [phone]

## 2020-01-28 NOTE — Telephone Encounter (Signed)
Called pt and started to explain that she needed to reach out to her PCP but ended up getting disconnected. Unable to reach when tried calling back.

## 2020-01-28 NOTE — Telephone Encounter (Signed)
Pt called back. She will reach out to her PCP again.

## 2020-01-29 NOTE — Telephone Encounter (Signed)
Portal message sent.

## 2020-04-20 MED ORDER — LANTUS SOLOSTAR U-100 INSULIN 100 UNIT/ML (3 ML) SUBCUTANEOUS PEN
100 unit/mL (3 mL) | SUBCUTANEOUS | 3 refills | Status: AC
Start: 2020-04-20 — End: ?

## 2020-06-18 MED ORDER — JANUVIA 100 MG TABLET
100 mg | ORAL_TABLET | ORAL | 3 refills | Status: AC
Start: 2020-06-18 — End: ?

## 2020-11-26 ENCOUNTER — Telehealth: Payer: Self-pay | Admitting: Oncology

## 2020-11-26 NOTE — Telephone Encounter (Signed)
Patient referred by Dr Buckner [place] for Von Willebrand Disease.  Appt made for 12/27/20 Labs 10:30 am - Consult 11:00 am  Informed Dr Melvyn Neth patient diagnosed over 35 years ago - Grandfather, Mother, Sister has this Disease also

## 2020-12-14 ENCOUNTER — Ambulatory Visit: Payer: 59 | Admitting: Sports Medicine

## 2020-12-26 ENCOUNTER — Other Ambulatory Visit: Payer: Self-pay | Admitting: Oncology

## 2020-12-26 DIAGNOSIS — D6800 Von Willebrand disease, unspecified: Secondary | ICD-10-CM

## 2020-12-26 NOTE — Progress Notes (Signed)
[facility]  [address] [place],  [place]  [address] [phone]  Clinic Day:  12/27/2020  Referring physician: Dr Buckner Malta   HISTORY OF PRESENT ILLNESS:  The patient is a 55 y.o. female who I was asked to consult upon for having von Willebrand disease.  According to the patient, she was diagnosed with type I von Willebrand disease at 55 years old after having excessive bleeding from wisdom teeth extraction.  She recalls having severe nosebleeds as a child.  She also recalls having heavy menstrual cycles to where ablation of her uterine lining was done 20+ years ago.  Of note, her maternal grandfather, mother, sister, multiple 1st cousins and daughter all have von Willebrand disease.  Fortunately, the patient has not had any recent bleeding/bruising problems.  The patient has used Stimate in the past to prevent perioperative bleeding complications.  She essentially comes in today to get a prescription for Stimate.  PAST MEDICAL HISTORY:   Past Medical History:  Diagnosis Date   Depression    Hyperlipidemia    TBI (traumatic brain injury) (HCC)    Von Willebrand disease (HCC)     PAST SURGICAL HISTORY:   Past Surgical History:  Procedure Laterality Date   BRAIN SURGERY     TRACHEOSTOMY CLOSURE     TRACHEOSTOMY TUBE PLACEMENT     UTERINE ABLATION     WISDOM TOOTH EXTRACTION      CURRENT MEDICATIONS:   Current Outpatient Medications  Medication Sig Dispense Refill   buPROPion (WELLBUTRIN XL) 300 MG 24 hr tablet Take 1 tablet by mouth daily.     buPROPion (ZYBAN) 150 MG 12 hr tablet Take by mouth.     desmopressin (STIMATE) 1.5 MG/ML SOLN Place 1 spray (0.15 mg total) into the nose daily as needed. 1 spray into EACH NOSTRIL as neede 2.5 mL 3   diclofenac Sodium (VOLTAREN) 1 % GEL Apply 4 g topically 4 (four) times daily. 150 g 1   meclizine (ANTIVERT) 25 MG tablet Take by mouth.     meloxicam (MOBIC) 15 MG tablet TAKE 1/2 TO 1  TABLET BY MOUTH EVERY DAY AS NEEDED FOR PAIN     methocarbamol (ROBAXIN) 500 MG tablet Take 1 tablet by mouth 2 (two) times daily.     ondansetron (ZOFRAN-ODT) 4 MG disintegrating tablet Take by mouth.     oxyCODONE-acetaminophen (PERCOCET) 10-325 MG tablet Take by mouth.     pregabalin (LYRICA) 75 MG capsule Take by mouth.     rizatriptan (MAXALT) 10 MG tablet Take by mouth.     rosuvastatin (CRESTOR) 20 MG tablet Take by mouth.     Topiramate ER (TROKENDI XR) 200 MG CP24 Take by mouth.     No current facility-administered medications for this visit.    ALLERGIES:   Allergies  Allergen Reactions   Aspirin Other (See Comments)    Von Willebrand Von Willebrand Von Willebrand Von Willebrand Von Willebrand Von Willebrand     FAMILY HISTORY:   Family History  Problem Relation Age of Onset   COPD Father    Von Willebrand disease Sister    Breast cancer Maternal Grandmother     SOCIAL HISTORY:  The patient was born in [place]  [place]  She is divorced, with 3 children.  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  She has smoked a half a pack of cigarettes daily x 37 years.  There is no history  of alcohol abuse.    REVIEW OF SYSTEMS:  Review of Systems  Constitutional:  Negative for fatigue and fever.       Night sweats  HENT:   Negative for hearing loss and sore throat.   Eyes:  Negative for eye problems.  Respiratory:  Negative for chest tightness, cough and hemoptysis.   Cardiovascular:  Negative for chest pain and palpitations.  Gastrointestinal:  Negative for abdominal distention, abdominal pain, blood in stool, constipation, diarrhea, nausea and vomiting.  Endocrine: Negative for hot flashes.  Genitourinary:  Negative for difficulty urinating, dysuria, frequency, hematuria and nocturia.   Musculoskeletal:  Positive for arthralgias. Negative for back pain, gait problem and myalgias.  Skin: Negative.  Negative for itching and rash.   Neurological:  Positive for headaches. Negative for dizziness, extremity weakness, gait problem, light-headedness and numbness.  Hematological: Negative.   Psychiatric/Behavioral:  Positive for depression. Negative for suicidal ideas. The patient is nervous/anxious.     PHYSICAL EXAM:  Blood pressure (!) 143/89, pulse 93, temperature 98.4 F (36.9 C), resp. rate 16, height 5' 4.6" (1.641 m), weight 176 lb 11.2 oz (80.2 kg), SpO2 97 %. Wt Readings from Last 3 Encounters:  12/27/20 176 lb 11.2 oz (80.2 kg)   Body mass index is 29.77 kg/m. Performance status (ECOG): 0 - Asymptomatic Physical Exam Constitutional:      Appearance: Normal appearance. She is not ill-appearing.  HENT:     Mouth/Throat:     Mouth: Mucous membranes are moist.     Pharynx: Oropharynx is clear. No oropharyngeal exudate or posterior oropharyngeal erythema.  Cardiovascular:     Rate and Rhythm: Normal rate and regular rhythm.     Heart sounds: No murmur heard.   No friction rub. No gallop.  Pulmonary:     Effort: Pulmonary effort is normal. No respiratory distress.     Breath sounds: Normal breath sounds. No wheezing, rhonchi or rales.  Chest:  Breasts:    Right: No axillary adenopathy or supraclavicular adenopathy.     Left: No axillary adenopathy or supraclavicular adenopathy.  Abdominal:     General: Bowel sounds are normal. There is no distension.     Palpations: Abdomen is soft. There is no mass.     Tenderness: There is no abdominal tenderness.  Musculoskeletal:        General: No swelling.     Right lower leg: No edema.     Left lower leg: No edema.  Lymphadenopathy:     Cervical: No cervical adenopathy.     Upper Body:     Right upper body: No supraclavicular or axillary adenopathy.     Left upper body: No supraclavicular or axillary adenopathy.     Lower Body: No right inguinal adenopathy. No left inguinal adenopathy.  Skin:    General: Skin is warm.     Coloration: Skin is not jaundiced.      Findings: No lesion or rash.  Neurological:     General: No focal deficit present.     Mental Status: She is alert and oriented to person, place, and time. Mental status is at baseline.     Cranial Nerves: Cranial nerves are intact.  Psychiatric:        Mood and Affect: Mood normal.        Behavior: Behavior normal.        Thought Content: Thought content normal.   LABS:   CBC Latest Ref Rng & Units 12/27/2020  WBC - 5.2  Hemoglobin  12.0 - 16.0 13.0  Hematocrit 36 - 46 38  Platelets 150 - 399 124(A)   CMP Latest Ref Rng & Units 12/27/2020  BUN 4 - 21 8  Creatinine 0.5 - 1.1 0.7  Sodium 137 - 147 141  Potassium 3.4 - 5.3 3.9  Chloride 99 - 108 110(A)  CO2 13 - 22 23(A)  Calcium 8.7 - 10.7 8.7  Alkaline Phos 25 - 125 61  AST 13 - 35 21  ALT 7 - 35 17     ASSESSMENT & PLAN:  A 55 y.o. female who I was asked to consult upon for type I von Willebrand disease.  Her labs today clearly reflect this being the case.  Fortunately, she has not had any recent complications from this disease.  I have no problem prescribing her Stimate nasal spray to protect her against any potential bleeding problems, particularly if any procedures or surgeries are needed in her future.  As she has no pressing issues, I will turn her care back over to her primary care office.  I would have no problem seeing her in the future if she develops complications related to her von Willebrand disease or other hematologic issues that require repeat clinical assessment.  The patient understands all the plans discussed today and is in agreement with them.  I do appreciate Dr Buckner Malta for his new consult.   Neidy Guerrieri Kirby Funk, MD

## 2020-12-27 ENCOUNTER — Other Ambulatory Visit: Payer: Self-pay | Admitting: Oncology

## 2020-12-27 ENCOUNTER — Inpatient Hospital Stay: Payer: 59 | Attending: Oncology

## 2020-12-27 ENCOUNTER — Encounter: Payer: Self-pay | Admitting: Oncology

## 2020-12-27 ENCOUNTER — Inpatient Hospital Stay (INDEPENDENT_AMBULATORY_CARE_PROVIDER_SITE_OTHER): Payer: 59 | Admitting: Oncology

## 2020-12-27 ENCOUNTER — Other Ambulatory Visit: Payer: Self-pay

## 2020-12-27 VITALS — BP 143/89 | HR 93 | Temp 98.4°F | Resp 16 | Ht 64.6 in | Wt 176.7 lb

## 2020-12-27 DIAGNOSIS — D6800 Von Willebrand disease, unspecified: Secondary | ICD-10-CM

## 2020-12-27 DIAGNOSIS — F419 Anxiety disorder, unspecified: Secondary | ICD-10-CM

## 2020-12-27 DIAGNOSIS — R519 Headache, unspecified: Secondary | ICD-10-CM

## 2020-12-27 DIAGNOSIS — M2550 Pain in unspecified joint: Secondary | ICD-10-CM

## 2020-12-27 DIAGNOSIS — F32A Depression, unspecified: Secondary | ICD-10-CM | POA: Diagnosis not present

## 2020-12-27 LAB — HEPATIC FUNCTION PANEL
ALT: 17 (ref 7–35)
AST: 21 (ref 13–35)
Alkaline Phosphatase: 61 (ref 25–125)
Bilirubin, Total: 0.4

## 2020-12-27 LAB — CBC AND DIFFERENTIAL
HCT: 38 (ref 36–46)
Hemoglobin: 13 (ref 12.0–16.0)
Neutrophils Absolute: 3.33
Platelets: 124 — AB (ref 150–399)
WBC: 5.2

## 2020-12-27 LAB — BASIC METABOLIC PANEL
BUN: 8 (ref 4–21)
CO2: 23 — AB (ref 13–22)
Chloride: 110 — AB (ref 99–108)
Creatinine: 0.7 (ref 0.5–1.1)
Glucose: 116
Potassium: 3.9 (ref 3.4–5.3)
Sodium: 141 (ref 137–147)

## 2020-12-27 LAB — COMPREHENSIVE METABOLIC PANEL
Albumin: 4.1 (ref 3.5–5.0)
Calcium: 8.7 (ref 8.7–10.7)

## 2020-12-27 LAB — PROTIME-INR
INR: 0.9 (ref 0.8–1.2)
Prothrombin Time: 12.5 seconds (ref 11.4–15.2)

## 2020-12-27 LAB — APTT: aPTT: 35 seconds (ref 24–36)

## 2020-12-27 LAB — CBC: RBC: 4.19 (ref 3.87–5.11)

## 2020-12-27 MED ORDER — DESMOPRESSIN ACETATE 1.5 MG/ML NA SOLN: 1.0000 | Freq: Every day | NASAL | 3 refills | Status: DC | PRN

## 2020-12-27 NOTE — Telephone Encounter (Signed)
Patient called and LVM after hours tuo cancel her apt for tomorrow. Patient did not leave a reason for cancelling.     I called her back and LVM to call back to reschedule.     Just letting you know.

## 2020-12-27 NOTE — Telephone Encounter (Signed)
FYI

## 2020-12-27 NOTE — Telephone Encounter (Signed)
Noted.

## 2020-12-28 ENCOUNTER — Encounter: Attending: "Endocrinology | Primary: Family Medicine

## 2020-12-28 ENCOUNTER — Ambulatory Visit (INDEPENDENT_AMBULATORY_CARE_PROVIDER_SITE_OTHER): Payer: 59 | Admitting: Sports Medicine

## 2020-12-28 ENCOUNTER — Encounter: Payer: Self-pay | Admitting: Sports Medicine

## 2020-12-28 ENCOUNTER — Ambulatory Visit (INDEPENDENT_AMBULATORY_CARE_PROVIDER_SITE_OTHER): Payer: 59

## 2020-12-28 DIAGNOSIS — M722 Plantar fascial fibromatosis: Secondary | ICD-10-CM

## 2020-12-28 DIAGNOSIS — M79672 Pain in left foot: Secondary | ICD-10-CM

## 2020-12-28 DIAGNOSIS — M7732 Calcaneal spur, left foot: Secondary | ICD-10-CM | POA: Diagnosis not present

## 2020-12-28 DIAGNOSIS — M7662 Achilles tendinitis, left leg: Secondary | ICD-10-CM

## 2020-12-28 MED ORDER — DICLOFENAC SODIUM 1 % EX GEL: 4.0000 g | Freq: Four times a day (QID) | CUTANEOUS | 1 refills | Status: DC

## 2020-12-28 NOTE — Progress Notes (Signed)
Subjective: Stacey Stewart is a 55 y.o. female patient who presents to office for evaluation of left heel pain.  Patient reports that there is pain to the back of her leg and has noticed a small little knot appearing states that pain has been consistently worse over the last 3 months but showed up approximately 5 months ago patient has tried changing shoes resting icing elevating soaking without relief and reports that she also tried once her topical pain rub does not recall any type of injury but reports that many years ago did have an issue with an injury or sprain but nothing recent.    Patient also admits to a limb length discrepancy with the left leg being shorter due to her history of trauma.  Patient Active Problem List   Diagnosis Date Noted   Benign paroxysmal positional vertigo of right ear 11/12/2019   Dyslipidemia 11/12/2019   Hot flashes due to menopause 11/12/2019   Short-term memory loss 11/12/2019   Chronic bilateral low back pain with left-sided sciatica 11/15/2016   Cigarette nicotine dependence without complication 11/15/2016   Anxiety 08/06/2014   History of cerebral hemorrhage 08/06/2014   Migraine without aura and without status migrainosus, not intractable 08/06/2014   Von Willebrand disease (HCC) 08/06/2014   Balance disorder 08/05/2014    Current Outpatient Medications on File Prior to Visit  Medication Sig Dispense Refill   buPROPion (WELLBUTRIN XL) 300 MG 24 hr tablet Take 1 tablet by mouth daily.     buPROPion (ZYBAN) 150 MG 12 hr tablet Take by mouth.     desmopressin (STIMATE) 1.5 MG/ML SOLN Place 1 spray (0.15 mg total) into the nose daily as needed. 1 spray into EACH NOSTRIL as neede 2.5 mL 3   meclizine (ANTIVERT) 25 MG tablet Take by mouth.     meloxicam (MOBIC) 15 MG tablet TAKE 1/2 TO 1 TABLET BY MOUTH EVERY DAY AS NEEDED FOR PAIN     methocarbamol (ROBAXIN) 500 MG tablet Take 1 tablet by mouth 2 (two) times daily.     ondansetron (ZOFRAN-ODT) 4 MG  disintegrating tablet Take by mouth.     oxyCODONE-acetaminophen (PERCOCET) 10-325 MG tablet Take by mouth.     pregabalin (LYRICA) 75 MG capsule Take by mouth.     rizatriptan (MAXALT) 10 MG tablet Take by mouth.     rosuvastatin (CRESTOR) 20 MG tablet Take by mouth.     Topiramate ER (TROKENDI XR) 200 MG CP24 Take by mouth.     No current facility-administered medications on file prior to visit.    Allergies  Allergen Reactions   Aspirin Other (See Comments)    Von Willebrand Von Willebrand Von Willebrand Von Willebrand Von Willebrand Von Willebrand     Objective:  General: Alert and oriented x3 in no acute distress  Dermatology: No open lesions bilateral lower extremities, no webspace macerations, no ecchymosis bilateral, all nails x 10 are well manicured.  Vascular: Dorsalis Pedis and Posterior Tibial pedal pulses 1/4, Capillary Fill Time 3 seconds, + pedal hair growth bilateral, trace edema bilateral lower extremities, Temperature gradient within normal limits.  Neurology: Michaell Cowing sensation intact via light touch bilateral.  Musculoskeletal: Mild to moderate tenderness with palpation at insertion of the Achilles on left greater than right, there is very minimal reproducible pain to the plantar surfaces of both feet likely patient is struggling with Achilles tendinitis more so than Planter fasciitis left greater than right, calcaneal exostosis with mild soft tissue swelling present and decreased ankle rom with  knee extending  vs flexed resembling gastroc equnius bilateral, The achilles tendon feels intact with no nodularity or palpable dell, Thompson sign negative, Subtalar and midtarsal joint range of motion is within normal limits with a dorsal bone spur noted bilateral, there is no 1st ray hypermobility or forefoot deformity noted bilateral.   Gait: Antalgic gait with increased heel off left.   Xrays  Left Foot    Impression: Normal osseous mineralization. Joint spaces  preserved. No fracture/dislocation/boney destruction.  Very minimal calcaneal spur present. Kager's triangle intact with no obliteration. No soft tissue abnormalities or radiopaque foreign bodies.   Assessment and Plan: Problem List Items Addressed This Visit   None Visit Diagnoses     Plantar fasciitis of left foot    -  Primary   Relevant Orders   DG Foot Complete Left   Achilles tendinitis, left leg       Calcaneal spur of left foot       Inflammatory pain of left heel           -Complete examination performed -Xrays reviewed -Discussed treatment options -Dispensed use CAM boot for patient to use as directed for 2 weeks after 2 weeks may slowly wean from cam boot if she is still having pain and is trying to wean from cam boot may call office for me to proceed with ordering an MRI for further evaluation to rule out any type of partial tear of the Achilles tendon on the left -Rx topical Voltaren for patient to use as directed since patient cannot take oral steroids and is already on oral anti-inflammatory and pain medication without relief  -No improvement will consider MRI as above -Patient to return to office if symptoms fail to improve or sooner if condition worsens.  Asencion Islam, DPM

## 2020-12-30 LAB — VON WILLEBRAND PANEL
Coagulation Factor VIII: 38 % — ABNORMAL LOW (ref 56–140)
Ristocetin Co-factor, Plasma: 15 % — ABNORMAL LOW (ref 50–200)
Von Willebrand Antigen, Plasma: 25 % — ABNORMAL LOW (ref 50–200)

## 2020-12-30 LAB — COAG STUDIES INTERP REPORT

## 2021-01-10 ENCOUNTER — Telehealth: Payer: Self-pay

## 2021-01-10 NOTE — Telephone Encounter (Signed)
Pt called and states her Lt heel pain is no better. Pt states if an MRI should be done. Dr. Marylene Land please advice

## 2021-01-11 ENCOUNTER — Other Ambulatory Visit: Payer: Self-pay | Admitting: Sports Medicine

## 2021-01-11 ENCOUNTER — Telehealth: Payer: Self-pay

## 2021-01-11 DIAGNOSIS — M7662 Achilles tendinitis, left leg: Secondary | ICD-10-CM

## 2021-01-11 DIAGNOSIS — M79672 Pain in left foot: Secondary | ICD-10-CM

## 2021-01-11 NOTE — Telephone Encounter (Signed)
Pt is electing to no longer see Dr Dwaine Gale going forward.

## 2021-01-11 NOTE — Telephone Encounter (Signed)
Just letting you know.   Patient no longer wants to be seen     Please cancel all active and future orders for the patient.   Appointments have been cancelled.

## 2021-01-11 NOTE — Telephone Encounter (Signed)
Contacted patient's [employer] company to obtained authorization for LT ankle w/o contrast.(73721) Case is pending for Review and Dr. Marylene Land needs to perform Peer-to-peer calling at [facility] at [phone] UPB#[id number]

## 2021-01-11 NOTE — Telephone Encounter (Signed)
Contacted [facility] and scheduled pt's appt for 7/18 for checking in at 7:15 AM Contacted pt and and no one answered so I LVM stating her appt date and time, pt was also instructed of location address and phone number.  I faxed MRI order form to [facility]

## 2021-01-11 NOTE — Progress Notes (Signed)
Left MRI ordered for [facility] [address]

## 2021-01-12 NOTE — Telephone Encounter (Signed)
FYI, and patient does not have any open orders.

## 2021-01-13 LAB — VON WILLEBRAND FACTOR MULTIMER

## 2021-01-13 NOTE — Telephone Encounter (Signed)
Called Pt and spoke to pt about MRI appt date and time

## 2021-01-24 ENCOUNTER — Telehealth: Payer: Self-pay

## 2021-01-24 NOTE — Telephone Encounter (Signed)
Pt is calling requesting results form her MRI

## 2021-01-26 ENCOUNTER — Telehealth (INDEPENDENT_AMBULATORY_CARE_PROVIDER_SITE_OTHER): Payer: 59 | Admitting: Sports Medicine

## 2021-01-26 ENCOUNTER — Encounter: Payer: Self-pay | Admitting: Sports Medicine

## 2021-01-26 DIAGNOSIS — M7732 Calcaneal spur, left foot: Secondary | ICD-10-CM

## 2021-01-26 DIAGNOSIS — M7662 Achilles tendinitis, left leg: Secondary | ICD-10-CM | POA: Diagnosis not present

## 2021-01-26 DIAGNOSIS — M722 Plantar fascial fibromatosis: Secondary | ICD-10-CM

## 2021-01-26 DIAGNOSIS — M79672 Pain in left foot: Secondary | ICD-10-CM | POA: Diagnosis not present

## 2021-01-26 NOTE — Progress Notes (Signed)
Virtual Visit via Telephone Note  I connected with Stacey Stewart on 01/26/21 at  8:00 AM EDT by telephone and verified that I am speaking with the correct person using two identifiers.  Location: Patient: Stacey Stewart home Provider: Landis Martins, DPM [facility] [place] office    I discussed the limitations, risks, security and privacy concerns of performing an evaluation and management service by telephone and the availability of in person appointments. I also discussed with the patient that there may be a patient responsible charge related to this service. The patient expressed understanding and agreed to proceed.   History of Present Illness: 55 year old female patient met via telephone visit to discuss MRI results of her left foot and ankle.  Patient reports that she is still having pain using her cam boot which seems to help some.  Patient denies any new trauma or injuries or any other pedal complaints at this time.   Observations/Objective: Physical exam unable to be performed due to telephone nature of visit  Assessment and Plan: Problem List Items Addressed This Visit   None Visit Diagnoses     Achilles tendinitis, left leg    -  Primary   Inflammatory pain of left heel       Plantar fasciitis of left foot       Calcaneal spur of left foot          MRI results reviewed consistent with peritendinitis of the Achilles tendon, an old ankle sprain injury and arthritis of the foot  -Discussed MRI results with patient as above -Advised patient to continue with cam boot -Advised patient to continue with topical Voltaren to the Achilles area for pain -Advised patient to alternate between heat and ice twice daily for additional relief -Prescribed physical therapy at [facility] for additional treatment options for Achilles tendinitis  Follow Up Instructions: Patient to go to physical therapy as directed and return to office if symptoms fail to continue to  improve after completing therapy   I discussed the assessment and treatment plan with the patient. The patient was provided an opportunity to ask questions and all were answered. The patient agreed with the plan and demonstrated an understanding of the instructions.   The patient was advised to call back or seek an in-person evaluation if the symptoms worsen or if the condition fails to improve as anticipated.  I provided 12 minutes of non-face-to-face time during this encounter.   Landis Martins, DPM

## 2021-03-28 ENCOUNTER — Telehealth: Payer: Self-pay | Admitting: Sports Medicine

## 2021-03-28 NOTE — Telephone Encounter (Signed)
Pt finished physical therapy with no relief.  Has f/u appt 04-15-21 and wants to know does she need to still wear boot.

## 2021-03-28 NOTE — Telephone Encounter (Signed)
Pt notified and understands/reb °

## 2021-04-15 ENCOUNTER — Ambulatory Visit: Payer: 59 | Admitting: Sports Medicine

## 2021-04-22 ENCOUNTER — Ambulatory Visit (INDEPENDENT_AMBULATORY_CARE_PROVIDER_SITE_OTHER): Payer: 59 | Admitting: Sports Medicine

## 2021-04-22 ENCOUNTER — Encounter: Payer: Self-pay | Admitting: Sports Medicine

## 2021-04-22 ENCOUNTER — Other Ambulatory Visit: Payer: Self-pay

## 2021-04-22 DIAGNOSIS — M79672 Pain in left foot: Secondary | ICD-10-CM

## 2021-04-22 DIAGNOSIS — M7662 Achilles tendinitis, left leg: Secondary | ICD-10-CM

## 2021-04-22 DIAGNOSIS — G8929 Other chronic pain: Secondary | ICD-10-CM

## 2021-04-22 MED ORDER — TRIAMCINOLONE ACETONIDE 10 MG/ML IJ SUSP
10.0000 mg | Freq: Once | INTRAMUSCULAR | Status: AC
  Administered 2021-04-22: 10 mg

## 2021-04-22 NOTE — Progress Notes (Signed)
Subjective: Stacey Stewart is a 55 y.o. female patient who presents to office for evaluation of left heel pain. Patient complains of progressive pain still on the left.  Patient reports that she does not feel any better after completing physical therapy states that her left heel still hurts and is still very painful.  Patient reports that she has her boot in the car and is wondering what other treatment options she has and does not want surgery.  Patient Active Problem List   Diagnosis Date Noted   Benign paroxysmal positional vertigo of right ear 11/12/2019   Dyslipidemia 11/12/2019   Hot flashes due to menopause 11/12/2019   Short-term memory loss 11/12/2019   Chronic bilateral low back pain with left-sided sciatica 11/15/2016   Cigarette nicotine dependence without complication 11/15/2016   Anxiety 08/06/2014   History of cerebral hemorrhage 08/06/2014   Migraine without aura and without status migrainosus, not intractable 08/06/2014   Von Willebrand disease 08/06/2014   Balance disorder 08/05/2014    Current Outpatient Medications on File Prior to Visit  Medication Sig Dispense Refill   buPROPion (WELLBUTRIN XL) 300 MG 24 hr tablet Take 1 tablet by mouth daily.     buPROPion (ZYBAN) 150 MG 12 hr tablet Take by mouth.     desmopressin (STIMATE) 1.5 MG/ML SOLN Place 1 spray (0.15 mg total) into the nose daily as needed. 1 spray into EACH NOSTRIL as neede 2.5 mL 3   diclofenac Sodium (VOLTAREN) 1 % GEL Apply 4 g topically 4 (four) times daily. 150 g 1   meclizine (ANTIVERT) 25 MG tablet Take by mouth.     meloxicam (MOBIC) 15 MG tablet TAKE 1/2 TO 1 TABLET BY MOUTH EVERY DAY AS NEEDED FOR PAIN     methocarbamol (ROBAXIN) 500 MG tablet Take 1 tablet by mouth 2 (two) times daily.     ondansetron (ZOFRAN-ODT) 4 MG disintegrating tablet Take by mouth.     oxyCODONE-acetaminophen (PERCOCET) 10-325 MG tablet Take by mouth.     pregabalin (LYRICA) 75 MG capsule Take by mouth.     rizatriptan  (MAXALT) 10 MG tablet Take by mouth.     rosuvastatin (CRESTOR) 20 MG tablet Take by mouth.     Topiramate ER (TROKENDI XR) 200 MG CP24 Take by mouth.     No current facility-administered medications on file prior to visit.    Allergies  Allergen Reactions   Aspirin Other (See Comments)    Von Willebrand Von Willebrand Von Willebrand Von Willebrand Von Willebrand Von Willebrand     Objective:  General: Alert and oriented x3 in no acute distress  Dermatology: No open lesions bilateral lower extremities, no webspace macerations, no ecchymosis bilateral, all nails x 10 are well manicured.  Vascular: Dorsalis Pedis and Posterior Tibial pedal pulses 1/4, Capillary Fill Time 3 seconds, + pedal hair growth bilateral, no edema bilateral lower extremities, Temperature gradient within normal limits.  Neurology: Michaell Cowing sensation intact via light touch bilateral.  Musculoskeletal: Moderate tenderness with palpation at insertion of the Achilles on left lateral>medial aspect of the tendon at the insertion, there is calcaneal exostosis with moderate soft tissue swelling present and decreased ankle rom with knee extending  vs flexed resembling gastroc equnius bilateral, The achilles tendon feels intact with no nodularity or palpable dell, Thompson sign negative.  Assessment and Plan: Problem List Items Addressed This Visit   None Visit Diagnoses     Achilles tendinitis, left leg    -  Primary   Relevant Medications  triamcinolone acetonide (KENALOG) 10 MG/ML injection 10 mg (Start on 04/22/2021 10:00 PM)   Inflammatory pain of left heel       Chronic heel pain, left       Relevant Medications   triamcinolone acetonide (KENALOG) 10 MG/ML injection 10 mg (Start on 04/22/2021 10:00 PM)         -Complete examination performed -Xrays reviewed -Discussed treatment options; injection, EPAT, surgery; Patient elects for injections and understands risks -After oral consent and aseptic prep,  injected a mixture containing 1 ml of 2%  plain lidocaine, 1 ml 0.5% plain marcaine, 0.5 ml of kenalog 10 and 0.5 ml of dexamethasone phosphate into left achilles insertion at lateral aspect with care not to violate the tendon/without complication. Post-injection care discussed with patient.  -Advised patient to rest and avoid strenuous activity -Advised patient to ice 20 minutes in the evening as directed -Advised patient to return to using CAM boot over the next few days and then may transition to using tennis shoe with heel lifts as long as pain is improving -Dispensed Surgigrip compression sleeve for patient to use for additional support and edema control at the heel -Patient to return to office in 3 weeks or sooner if condition worsens.  Advised patient at next visit if her heel pain is better then we will further discuss checking benefits for orthotics with her insurance for a accommodation/buildup for the left side which is her shorter limb.  Asencion Islam, DPM

## 2021-05-13 ENCOUNTER — Other Ambulatory Visit: Payer: Self-pay | Admitting: Oncology

## 2021-05-24 ENCOUNTER — Ambulatory Visit (INDEPENDENT_AMBULATORY_CARE_PROVIDER_SITE_OTHER): Payer: 59 | Admitting: Sports Medicine

## 2021-05-24 ENCOUNTER — Encounter: Payer: Self-pay | Admitting: Sports Medicine

## 2021-05-24 DIAGNOSIS — M7662 Achilles tendinitis, left leg: Secondary | ICD-10-CM

## 2021-05-24 DIAGNOSIS — M79672 Pain in left foot: Secondary | ICD-10-CM

## 2021-05-24 DIAGNOSIS — G8929 Other chronic pain: Secondary | ICD-10-CM

## 2021-05-24 MED ORDER — TRIAMCINOLONE ACETONIDE 10 MG/ML IJ SUSP
10.0000 mg | Freq: Once | INTRAMUSCULAR | Status: AC
  Administered 2021-05-24: 10 mg

## 2021-05-24 NOTE — Progress Notes (Signed)
Subjective: Stacey Stewart is a 55 y.o. female patient who presents to office for evaluation of left heel pain. Patient reports that the pain is doing better 3-4 out of 10 at the back of her left heel states that the Surgigrip compression sleeve helps and the swelling is very minimal.  Patient denies any other pedal complaints at this time.  Patient Active Problem List   Diagnosis Date Noted   Benign paroxysmal positional vertigo of right ear 11/12/2019   Dyslipidemia 11/12/2019   Hot flashes due to menopause 11/12/2019   Short-term memory loss 11/12/2019   Chronic bilateral low back pain with left-sided sciatica 11/15/2016   Cigarette nicotine dependence without complication 11/15/2016   Anxiety 08/06/2014   History of cerebral hemorrhage 08/06/2014   Migraine without aura and without status migrainosus, not intractable 08/06/2014   Von Willebrand disease 08/06/2014   Balance disorder 08/05/2014    Current Outpatient Medications on File Prior to Visit  Medication Sig Dispense Refill   buPROPion (WELLBUTRIN XL) 300 MG 24 hr tablet Take 1 tablet by mouth daily.     buPROPion (ZYBAN) 150 MG 12 hr tablet Take by mouth.     desmopressin (STIMATE) 1.5 MG/ML SOLN Place 1 spray (0.15 mg total) into the nose daily as needed. 1 spray into EACH NOSTRIL as neede 2.5 mL 3   diclofenac Sodium (VOLTAREN) 1 % GEL Apply 4 g topically 4 (four) times daily. 150 g 1   meclizine (ANTIVERT) 25 MG tablet Take by mouth.     meloxicam (MOBIC) 15 MG tablet TAKE 1/2 TO 1 TABLET BY MOUTH EVERY DAY AS NEEDED FOR PAIN     methocarbamol (ROBAXIN) 500 MG tablet Take 1 tablet by mouth 2 (two) times daily.     ondansetron (ZOFRAN-ODT) 4 MG disintegrating tablet Take by mouth.     oxyCODONE-acetaminophen (PERCOCET) 10-325 MG tablet Take by mouth.     pregabalin (LYRICA) 75 MG capsule Take by mouth.     rizatriptan (MAXALT) 10 MG tablet Take by mouth.     rosuvastatin (CRESTOR) 20 MG tablet Take by mouth.     Topiramate ER  (TROKENDI XR) 200 MG CP24 Take by mouth.     No current facility-administered medications on file prior to visit.    Allergies  Allergen Reactions   Aspirin Other (See Comments)    Von Willebrand Von Willebrand Von Willebrand Von Willebrand Von Willebrand Von Willebrand     Objective:  General: Alert and oriented x3 in no acute distress  Dermatology: No open lesions bilateral lower extremities, no webspace macerations, no ecchymosis bilateral, all nails x 10 are well manicured.  Vascular: Dorsalis Pedis and Posterior Tibial pedal pulses 1/4, Capillary Fill Time 3 seconds, + pedal hair growth bilateral, no edema bilateral lower extremities, Temperature gradient within normal limits.  Neurology: Michaell Cowing sensation intact via light touch bilateral.  Musculoskeletal: Mild to moderate tenderness with palpation at insertion of the Achilles on left lateral>medial aspect of the tendon at the insertion, there is calcaneal exostosis with moderate soft tissue swelling present and decreased ankle rom with knee extending  vs flexed resembling gastroc equnius bilateral, The achilles tendon feels intact with no nodularity or palpable dell, Thompson sign negative.  No pain to the plantar heel at this time, all pain is posterior at the Achilles insertion.  Assessment and Plan: Problem List Items Addressed This Visit   None Visit Diagnoses     Achilles tendinitis, left leg    -  Primary  Inflammatory pain of left heel       Chronic heel pain, left             -Complete examination performed -Re-Discussed treatment options for Achilles tendinitis; injection, EPAT, surgery; Patient elects for injection again this visit because it is helped last time -After oral consent and aseptic prep, injected a mixture containing 1 ml of 2% plain lidocaine, 1 ml 0.5% plain marcaine, 0.5 ml of kenalog 10 and 0.5 ml of dexamethasone phosphate into left achilles insertion at lateral aspect with care not to  violate the tendon/without complication. Post-injection care discussed with patient.  This is injection #2 to the area. -Advised patient to rest and avoid strenuous activity on today and to ice as directed -Continue with Surgigrip compression sleeve for patient to use for additional support and edema control at the heel -Patient to return to office in 3 to 4 weeks for follow-up evaluation of left posterior heel pain.  Asencion Islam, DPM

## 2021-05-30 NOTE — Telephone Encounter (Signed)
Patient is requesting refill. She was sent a letter to schedule appointment but has still not rescheduled. Please advise on refill, not OV or labs done since last year.

## 2021-05-30 NOTE — Telephone Encounter (Signed)
Please ask the patient to reach out to her PCPs office for refill.  We do not have any labs or office visit for over a year now.

## 2021-06-03 NOTE — Telephone Encounter (Signed)
Call to [facility] - No answer and nonidentifying VM. Left generic message requesting call back.    If/when patient calls back, it is okay for non-clinical staff to relay my exact comments should the patient call back for the message/results.    Comments: Per Dr. Dwaine Gale, we are unable to fill this medication as Olubunmi has not been seen by provider for 1.5 years. Patient needs to schedule/complete visit and discuss any updating of labs needed for therapeutic management. Please assist in scheduling patient with Dr. Dwaine Gale should she call back and please encourage her to request this refill from her PCP at this time.            FYI to provider some labs located on [facility] completed in 03/2021 and 05/2021 inclusive of MAU, CMP, osmolality, lipase, CBC with diff, and D-dimer.

## 2021-06-03 NOTE — Telephone Encounter (Signed)
Noted.    FYI to provider.

## 2021-06-03 NOTE — Telephone Encounter (Signed)
Pt called back and advised that her PCP is refilling her prescriptions for Insulin and doesn't need Dr. Dwaine Gale to refill this for her. Pt stated that her Pharm wasn't aware that she doesn't see Dr. Dwaine Gale for this any longer.     Please Advise [phone]

## 2021-09-06 ENCOUNTER — Encounter: Payer: Self-pay | Admitting: Sports Medicine

## 2021-09-06 ENCOUNTER — Other Ambulatory Visit: Payer: Self-pay

## 2021-09-06 ENCOUNTER — Ambulatory Visit: Payer: Managed Care, Other (non HMO) | Admitting: Sports Medicine

## 2021-09-06 DIAGNOSIS — G8929 Other chronic pain: Secondary | ICD-10-CM

## 2021-09-06 DIAGNOSIS — M7732 Calcaneal spur, left foot: Secondary | ICD-10-CM

## 2021-09-06 DIAGNOSIS — M7662 Achilles tendinitis, left leg: Secondary | ICD-10-CM

## 2021-09-06 DIAGNOSIS — M79672 Pain in left foot: Secondary | ICD-10-CM

## 2021-09-06 MED ORDER — TRIAMCINOLONE ACETONIDE 10 MG/ML IJ SUSP: 10.0000 mg | Freq: Once | INTRAMUSCULAR | Status: DC

## 2021-09-06 NOTE — Progress Notes (Signed)
Subjective: ?Stacey Stewart is a 56 y.o. female patient who presents to office for follow-up evaluation of left heel pain. Patient reports that the pain has flared back up over the last 2 to 3 weeks.  Patient denies any changes with activity but does states that it is sore and burns and swells and states previous injection and compression sock has seemed to help. ? ?Patient Active Problem List  ? Diagnosis Date Noted  ? Benign paroxysmal positional vertigo of right ear 11/12/2019  ? Dyslipidemia 11/12/2019  ? Hot flashes due to menopause 11/12/2019  ? Short-term memory loss 11/12/2019  ? Chronic bilateral low back pain with left-sided sciatica 11/15/2016  ? Cigarette nicotine dependence without complication [id number]  ? Tobacco abuse 11/15/2016  ? Anxiety 08/06/2014  ? History of cerebral hemorrhage 08/06/2014  ? Migraine without aura and without status migrainosus, not intractable 08/06/2014  ? Von Willebrand disease 08/06/2014  ? Acute headache 08/06/2014  ? Balance disorder 08/05/2014  ? ? ?Current Outpatient Medications on File Prior to Visit  ?Medication Sig Dispense Refill  ? albuterol (VENTOLIN HFA) 108 (90 Base) MCG/ACT inhaler Inhale into the lungs.    ? butalbital-acetaminophen-caffeine (FIORICET WITH CODEINE) 50-325-40-30 MG capsule Take by mouth.    ? morphine (MS CONTIN) 15 MG 12 hr tablet Take by mouth.    ? sertraline (ZOLOFT) 100 MG tablet TAKE 1 TABLET(100 MG) BY MOUTH DAILY    ? tiZANidine (ZANAFLEX) 4 MG tablet Take 1 tablet by mouth as needed.    ? varenicline (CHANTIX) 1 MG tablet Take by mouth.    ? buPROPion (WELLBUTRIN XL) 300 MG 24 hr tablet Take 1 tablet by mouth daily.    ? buPROPion (ZYBAN) 150 MG 12 hr tablet Take by mouth.    ? desmopressin (DDAVP NASAL) 0.01 % solution SMARTSIG:1 Spray(s) Both Nares Daily PRN    ? desmopressin (STIMATE) 1.5 MG/ML SOLN Place 1 spray (0.15 mg total) into the nose daily as needed. 1 spray into EACH NOSTRIL as neede 2.5 mL 3  ? diclofenac Sodium (VOLTAREN) 1 %  GEL Apply 4 g topically 4 (four) times daily. 150 g 1  ? Gabapentin, Once-Daily, 300 & 600 MG MISC Take by mouth.    ? LAGEVRIO 200 MG CAPS capsule Take 4 capsules by mouth 2 (two) times daily.    ? meclizine (ANTIVERT) 25 MG tablet Take by mouth.    ? meloxicam (MOBIC) 15 MG tablet TAKE 1/2 TO 1 TABLET BY MOUTH EVERY DAY AS NEEDED FOR PAIN    ? methocarbamol (ROBAXIN) 500 MG tablet Take by mouth.    ? mupirocin ointment (BACTROBAN) 2 % Apply topically 2 (two) times daily.    ? ondansetron (ZOFRAN-ODT) 4 MG disintegrating tablet Take by mouth.    ? predniSONE (DELTASONE) 10 MG tablet Take by mouth.    ? pregabalin (LYRICA) 75 MG capsule Take by mouth.    ? rizatriptan (MAXALT) 10 MG tablet Take by mouth.    ? rosuvastatin (CRESTOR) 20 MG tablet Take by mouth.    ? Topiramate ER (TROKENDI XR) 200 MG CP24 Take by mouth.    ? valACYclovir (VALTREX) 1000 MG tablet Take 1,000 mg by mouth 3 (three) times daily.    ? ?No current facility-administered medications on file prior to visit.  ? ? ?Allergies  ?Allergen Reactions  ? Aspirin Other (See Comments)  ?  Von Willebrand ?Von Willebrand ?Von Willebrand ?Von Willebrand ?Von Willebrand ?Von Willebrand ?  ? ? ?Objective:  ?General:  Alert and oriented x3 in no acute distress ? ?Dermatology: No open lesions bilateral lower extremities, no webspace macerations, no ecchymosis bilateral, all nails x 10 are well manicured. ? ?Vascular: Dorsalis Pedis and Posterior Tibial pedal pulses 1/4, Capillary Fill Time 3 seconds, + pedal hair growth bilateral, no edema bilateral lower extremities, Temperature gradient within normal limits. ? ?Neurology: Gross sensation intact via light touch bilateral. ? ?Musculoskeletal: Mild to moderate tenderness with palpation at insertion of the Achilles on left medial greater than lateral aspect of the tendon at the insertion, there is calcaneal exostosis with moderate soft tissue swelling present and decreased ankle rom with knee extending  vs flexed  resembling gastroc equnius bilateral, The achilles tendon feels intact with no nodularity or palpable dell, Thompson sign negative.  No pain to the plantar heel at this time, all pain is posterior at the Achilles insertion as previously noted on the left. ? ?Assessment and Plan: ?Problem List Items Addressed This Visit   ?None ?Visit Diagnoses   ? ? Achilles tendinitis, left leg    -  Primary  ? Relevant Medications  ? triamcinolone acetonide (KENALOG) 10 MG/ML injection 10 mg (Start on 09/06/2021  9:15 AM)  ? Inflammatory pain of left heel      ? Calcaneal spur of left foot      ? Chronic heel pain, left      ? Relevant Medications  ? butalbital-acetaminophen-caffeine (FIORICET WITH CODEINE) 50-325-40-30 MG capsule  ? methocarbamol (ROBAXIN) 500 MG tablet  ? morphine (MS CONTIN) 15 MG 12 hr tablet  ? predniSONE (DELTASONE) 10 MG tablet  ? sertraline (ZOLOFT) 100 MG tablet  ? tiZANidine (ZANAFLEX) 4 MG tablet  ? Topiramate ER (TROKENDI XR) 200 MG CP24  ? triamcinolone acetonide (KENALOG) 10 MG/ML injection 10 mg (Start on 09/06/2021  9:15 AM)  ? ?  ? ? ? ? ?-Complete examination performed ?-Re-Discussed treatment options for Achilles tendinitis; injection, EPAT, surgery; Patient elects for injection again this visit because it is helped last time ?-After oral consent and aseptic prep, injected a mixture containing 1 ml of 2% plain lidocaine, 1 ml 0.5% plain marcaine, 0.5 ml of kenalog 10 and 0.5 ml of dexamethasone phosphate into left achilles insertion at medial aspect with care not to violate the tendon/without complication. Post-injection care discussed with patient.  This is injection #1 to the area for the year. ?-Advised patient to rest and avoid strenuous activity on today and to ice as directed ?-Advised good supportive shoes that do not rub or irritate the back of the heel ?-Dispensed Achilles sleeve for patient to use as directed ?-Patient to return to office if no better in 3 to 4 weeks for follow-up  evaluation of left posterior heel pain or sooner if problems or issues arise. ? ?Landis Martins, DPM ? ?

## 2021-09-16 ENCOUNTER — Other Ambulatory Visit: Payer: Self-pay

## 2021-09-16 ENCOUNTER — Ambulatory Visit: Payer: Managed Care, Other (non HMO) | Admitting: Sports Medicine

## 2021-09-16 ENCOUNTER — Encounter: Payer: Self-pay | Admitting: Sports Medicine

## 2021-09-16 DIAGNOSIS — M79672 Pain in left foot: Secondary | ICD-10-CM | POA: Diagnosis not present

## 2021-09-16 DIAGNOSIS — G8929 Other chronic pain: Secondary | ICD-10-CM | POA: Diagnosis not present

## 2021-09-16 DIAGNOSIS — M7732 Calcaneal spur, left foot: Secondary | ICD-10-CM

## 2021-09-16 DIAGNOSIS — M7662 Achilles tendinitis, left leg: Secondary | ICD-10-CM | POA: Diagnosis not present

## 2021-09-16 MED ORDER — PREDNISONE 10 MG (21) PO TBPK: ORAL_TABLET | ORAL | 0 refills | Status: DC

## 2021-09-16 MED ORDER — TRIAMCINOLONE ACETONIDE 10 MG/ML IJ SUSP
10.0000 mg | Freq: Once | INTRAMUSCULAR | Status: AC
  Administered 2021-09-16: 10 mg

## 2021-09-16 NOTE — Progress Notes (Signed)
Subjective: ?Stacey Stewart is a 56 y.o. female patient who presents to office for follow-up evaluation of left heel pain. Patient reports that there is no pain on the medial side where I gave her the injection last time but she has some pain on the outside of the heel and wants to see if she can get more medicine to this area since the shot helped the inside states that the sleeve helps some and states that she is still trying to find supportive shoes that do not rub the back of the heel.  Patient denies any other pedal complaints at that time. ? ?Patient Active Problem List  ? Diagnosis Date Noted  ? Benign paroxysmal positional vertigo of right ear 11/12/2019  ? Dyslipidemia 11/12/2019  ? Hot flashes due to menopause 11/12/2019  ? Short-term memory loss 11/12/2019  ? Chronic bilateral low back pain with left-sided sciatica 11/15/2016  ? Cigarette nicotine dependence without complication [id number]  ? Tobacco abuse 11/15/2016  ? Anxiety 08/06/2014  ? History of cerebral hemorrhage 08/06/2014  ? Migraine without aura and without status migrainosus, not intractable 08/06/2014  ? Von Willebrand disease 08/06/2014  ? Acute headache 08/06/2014  ? Balance disorder 08/05/2014  ? ? ?Current Outpatient Medications on File Prior to Visit  ?Medication Sig Dispense Refill  ? albuterol (VENTOLIN HFA) 108 (90 Base) MCG/ACT inhaler Inhale into the lungs.    ? buPROPion (WELLBUTRIN XL) 300 MG 24 hr tablet Take 1 tablet by mouth daily.    ? buPROPion (ZYBAN) 150 MG 12 hr tablet Take by mouth.    ? butalbital-acetaminophen-caffeine (FIORICET WITH CODEINE) 50-325-40-30 MG capsule Take by mouth.    ? desmopressin (DDAVP NASAL) 0.01 % solution SMARTSIG:1 Spray(s) Both Nares Daily PRN    ? desmopressin (STIMATE) 1.5 MG/ML SOLN Place 1 spray (0.15 mg total) into the nose daily as needed. 1 spray into EACH NOSTRIL as neede 2.5 mL 3  ? diclofenac Sodium (VOLTAREN) 1 % GEL Apply 4 g topically 4 (four) times daily. 150 g 1  ? Gabapentin,  Once-Daily, 300 & 600 MG MISC Take by mouth.    ? LAGEVRIO 200 MG CAPS capsule Take 4 capsules by mouth 2 (two) times daily.    ? meclizine (ANTIVERT) 25 MG tablet Take by mouth.    ? meloxicam (MOBIC) 15 MG tablet TAKE 1/2 TO 1 TABLET BY MOUTH EVERY DAY AS NEEDED FOR PAIN    ? methocarbamol (ROBAXIN) 500 MG tablet Take by mouth.    ? morphine (MS CONTIN) 15 MG 12 hr tablet Take by mouth.    ? mupirocin ointment (BACTROBAN) 2 % Apply topically 2 (two) times daily.    ? ondansetron (ZOFRAN-ODT) 4 MG disintegrating tablet Take by mouth.    ? pregabalin (LYRICA) 75 MG capsule Take by mouth.    ? rizatriptan (MAXALT) 10 MG tablet Take by mouth.    ? rosuvastatin (CRESTOR) 20 MG tablet Take by mouth.    ? sertraline (ZOLOFT) 100 MG tablet TAKE 1 TABLET(100 MG) BY MOUTH DAILY    ? tiZANidine (ZANAFLEX) 4 MG tablet Take 1 tablet by mouth as needed.    ? Topiramate ER (TROKENDI XR) 200 MG CP24 Take by mouth.    ? valACYclovir (VALTREX) 1000 MG tablet Take 1,000 mg by mouth 3 (three) times daily.    ? varenicline (CHANTIX) 1 MG tablet Take by mouth.    ? ?Current Facility-Administered Medications on File Prior to Visit  ?Medication Dose Route Frequency Provider Last Rate  Last Admin  ? triamcinolone acetonide (KENALOG) 10 MG/ML injection 10 mg  10 mg Other Once Landis Martins, DPM      ? ? ?Allergies  ?Allergen Reactions  ? Aspirin Other (See Comments)  ?  Von Willebrand ?Von Willebrand ?Von Willebrand ?Von Willebrand ?Von Willebrand ?Von Willebrand ?  ? ? ?Objective:  ?General: Alert and oriented x3 in no acute distress ? ?Dermatology: No open lesions bilateral lower extremities, no webspace macerations, no ecchymosis bilateral, all nails x 10 are well manicured. ? ?Vascular: Dorsalis Pedis and Posterior Tibial pedal pulses 1/4, Capillary Fill Time 3 seconds, + pedal hair growth bilateral, no edema bilateral lower extremities, Temperature gradient within normal limits. ? ?Neurology: Gross sensation intact via light touch  bilateral. ? ?Musculoskeletal: Mild to moderate tenderness with palpation at insertion of the Achilles on left today lateral greater than medial aspect of the tendon at the insertion, there is calcaneal exostosis with moderate soft tissue swelling present and decreased ankle rom with knee extending  vs flexed resembling gastroc equnius bilateral, The achilles tendon feels intact with no nodularity or palpable dell, Thompson sign negative.  No pain to the plantar heel at this time, all pain is posterior at the Achilles insertion as previously noted on the left. ? ?Assessment and Plan: ?Problem List Items Addressed This Visit   ?None ?Visit Diagnoses   ? ? Achilles tendinitis, left leg    -  Primary  ? Inflammatory pain of left heel      ? Calcaneal spur of left foot      ? Chronic heel pain, left      ? Relevant Medications  ? predniSONE (STERAPRED UNI-PAK 21 TAB) 10 MG (21) TBPK tablet  ? ?  ? ? ? ? ?-Complete examination performed ?-Re-Discussed treatment options for Achilles tendinitis; injection at lateral aspect since this is away from the area that was previously injected 10 days ago, EPAT, surgery; Patient elects for injection again this visit because it is helped last time and does not want to try shockwave or surgery at this time ?-After oral consent and aseptic prep, injected a mixture containing 1 ml of 2% plain lidocaine, 1 ml 0.5% plain marcaine, 0.5 ml of kenalog 10 and 0.5 ml of dexamethasone phosphate into left achilles insertion at lateral aspect with care not to violate the tendon/without complication. Post-injection care discussed with patient.  This is injection #1 to the area for the year. ?-Rx oral prednisone to help with any additional pain at the Achilles insertion ?-Advised patient to rest and avoid strenuous activity on today and to ice as directed and return to her cam boot if needed ?-Dispensed heel lifts for patient to use as directed ?-Advised good supportive shoes that do not rub or  irritate the back of the heel like before ?-Continue with Achilles sleeve for patient to use as directed to avoid rubbing at the back of the heel ?-Patient to return to office if no better in 3 to 4 weeks for follow-up evaluation of left posterior heel pain or sooner if problems or issues arise.  Advised patient at some point if the injections are no longer working may need aggressive care as above. ?Landis Martins, DPM ? ?

## 2021-09-27 ENCOUNTER — Other Ambulatory Visit: Payer: Self-pay

## 2021-09-27 ENCOUNTER — Ambulatory Visit: Payer: Managed Care, Other (non HMO) | Admitting: Sports Medicine

## 2021-09-27 ENCOUNTER — Encounter: Payer: Self-pay | Admitting: Sports Medicine

## 2021-09-27 ENCOUNTER — Other Ambulatory Visit: Payer: Self-pay | Admitting: Sports Medicine

## 2021-09-27 ENCOUNTER — Ambulatory Visit (INDEPENDENT_AMBULATORY_CARE_PROVIDER_SITE_OTHER): Payer: Managed Care, Other (non HMO)

## 2021-09-27 DIAGNOSIS — M7660 Achilles tendinitis, unspecified leg: Secondary | ICD-10-CM | POA: Diagnosis not present

## 2021-09-27 DIAGNOSIS — M722 Plantar fascial fibromatosis: Secondary | ICD-10-CM | POA: Diagnosis not present

## 2021-09-27 DIAGNOSIS — M79672 Pain in left foot: Secondary | ICD-10-CM | POA: Diagnosis not present

## 2021-09-27 DIAGNOSIS — S99922A Unspecified injury of left foot, initial encounter: Secondary | ICD-10-CM

## 2021-09-27 DIAGNOSIS — G8929 Other chronic pain: Secondary | ICD-10-CM

## 2021-09-27 DIAGNOSIS — S86012A Strain of left Achilles tendon, initial encounter: Secondary | ICD-10-CM | POA: Diagnosis not present

## 2021-09-27 MED ORDER — PREDNISONE 10 MG (21) PO TBPK: ORAL_TABLET | ORAL | 0 refills | Status: DC

## 2021-09-27 NOTE — Progress Notes (Signed)
Subjective: ?Stacey Stewart is a 56 y.o. female patient who presents to office for excruciating heel pain heard a pop when she walked a heel on yesterday at the back of the left heel and states that she can barely walk or put pressure on the left foot states that she has been elevating icing and trying to rest the foot but still needs to work.  Patient states that since I gave her the injection on the left the lateral heel it still has some bruising and the bruise has not went away.  Patient denies any other pedal complaints at this time and states that the pain is worse at the back of the heel as compared to the bottom.  Patient able to walk and stand but it is painful with localized swelling and bruising.  No other pedal complaints noted at this time. ? ?Patient Active Problem List  ? Diagnosis Date Noted  ? Benign paroxysmal positional vertigo of right ear 11/12/2019  ? Dyslipidemia 11/12/2019  ? Hot flashes due to menopause 11/12/2019  ? Short-term memory loss 11/12/2019  ? Chronic bilateral low back pain with left-sided sciatica 11/15/2016  ? Cigarette nicotine dependence without complication 11/15/2016  ? Tobacco abuse 11/15/2016  ? Anxiety 08/06/2014  ? History of cerebral hemorrhage 08/06/2014  ? Migraine without aura and without status migrainosus, not intractable 08/06/2014  ? Von Willebrand disease 08/06/2014  ? Acute headache 08/06/2014  ? Balance disorder 08/05/2014  ? ? ?Current Outpatient Medications on File Prior to Visit  ?Medication Sig Dispense Refill  ? albuterol (VENTOLIN HFA) 108 (90 Base) MCG/ACT inhaler Inhale into the lungs.    ? buPROPion (WELLBUTRIN XL) 300 MG 24 hr tablet Take 1 tablet by mouth daily.    ? buPROPion (ZYBAN) 150 MG 12 hr tablet Take by mouth.    ? butalbital-acetaminophen-caffeine (FIORICET WITH CODEINE) 50-325-40-30 MG capsule Take by mouth.    ? desmopressin (DDAVP NASAL) 0.01 % solution SMARTSIG:1 Spray(s) Both Nares Daily PRN    ? desmopressin (STIMATE) 1.5 MG/ML SOLN Place  1 spray (0.15 mg total) into the nose daily as needed. 1 spray into EACH NOSTRIL as neede 2.5 mL 3  ? diclofenac Sodium (VOLTAREN) 1 % GEL Apply 4 g topically 4 (four) times daily. 150 g 1  ? Gabapentin, Once-Daily, 300 & 600 MG MISC Take by mouth.    ? LAGEVRIO 200 MG CAPS capsule Take 4 capsules by mouth 2 (two) times daily.    ? meclizine (ANTIVERT) 25 MG tablet Take by mouth.    ? meloxicam (MOBIC) 15 MG tablet TAKE 1/2 TO 1 TABLET BY MOUTH EVERY DAY AS NEEDED FOR PAIN    ? methocarbamol (ROBAXIN) 500 MG tablet Take by mouth.    ? morphine (MS CONTIN) 15 MG 12 hr tablet Take by mouth.    ? mupirocin ointment (BACTROBAN) 2 % Apply topically 2 (two) times daily.    ? ondansetron (ZOFRAN-ODT) 4 MG disintegrating tablet Take by mouth.    ? predniSONE (STERAPRED UNI-PAK 21 TAB) 10 MG (21) TBPK tablet Take as directed 21 tablet 0  ? pregabalin (LYRICA) 75 MG capsule Take by mouth.    ? rizatriptan (MAXALT) 10 MG tablet Take by mouth.    ? rosuvastatin (CRESTOR) 20 MG tablet Take by mouth.    ? sertraline (ZOLOFT) 100 MG tablet TAKE 1 TABLET(100 MG) BY MOUTH DAILY    ? tiZANidine (ZANAFLEX) 4 MG tablet Take 1 tablet by mouth as needed.    ?  Topiramate ER (TROKENDI XR) 200 MG CP24 Take by mouth.    ? valACYclovir (VALTREX) 1000 MG tablet Take 1,000 mg by mouth 3 (three) times daily.    ? varenicline (CHANTIX) 1 MG tablet Take by mouth.    ? ?Current Facility-Administered Medications on File Prior to Visit  ?Medication Dose Route Frequency Provider Last Rate Last Admin  ? triamcinolone acetonide (KENALOG) 10 MG/ML injection 10 mg  10 mg Other Once Asencion IslamStover, Randie Bloodgood, DPM      ? ? ?Allergies  ?Allergen Reactions  ? Aspirin Other (See Comments)  ?  Von Willebrand ?Von Willebrand ?Von Willebrand ?Von Willebrand ?Von Willebrand ?Von Willebrand ?  ? ? ?Objective:  ?General: Alert and oriented x3 in no acute distress ? ?Dermatology: No open lesions bilateral lower extremities, no webspace macerations, ecchymosis at the lateral  heel at previous area of injection on the left, all nails x 10 are well manicured. ? ?Vascular: Dorsalis Pedis and Posterior Tibial pedal pulses 1/4, Capillary Fill Time 3 seconds, + pedal hair growth bilateral, no edema bilateral lower extremities, Temperature gradient within normal limits. ? ?Neurology: Gross sensation intact via light touch bilateral. ? ?Musculoskeletal: Moderate tenderness diffusely across the Achilles insertion on the left heel, there is also pain with palpation to the central plantar and medial calcaneal tubercle at the plantar fascial insertion on the left heel, the Achilles tendon is palpable along the insertion however since patient felt a pop I am suspicious of a partial tear, there is no palpable gap.  Still noted on the left Achilles, there is adequate strength however there is mild guarding due to pain on the left, Thompson sign is negative on the left.   ? ?Left foot x-ray, there is no acute changes from previous x-ray unchanged posterior heel spur is minimal, inferior heel spur is larger, Kager's triangle is present with minimal obliteration, there is focal soft tissue swelling noted to the posterior heel.  No foreign body. ? ?Assessment and Plan: ?Problem List Items Addressed This Visit   ?None ?Visit Diagnoses   ? ? Achilles tendon pain    -  Primary  ? Relevant Orders  ? For home use only DME Other see comment  ? Achilles tendon tear, left, initial encounter      ? Relevant Orders  ? For home use only DME Other see comment  ? Plantar fasciitis of left foot      ? Chronic heel pain, left      ? ?  ? ? ?-Complete examination performed ?-X-rays reviewed ?-Discussed with patient concern of possible partial Achilles tear in the setting that patient felt a pop and has had increased pain to the left heel even though clinically I can feel the tendon and it feels intact I am still concerned due to patient chronic history of heel pain and previous history of injections that there could be a  tear to the left Achilles a tendon at this time we will order MRI for further evaluation of possible tear; advised patient if there is a partial tear may benefit long-term from conservative care and casting to prevent rupture however if there is a full-thickness tear may need surgery; patient reports that at this time she cannot do any surgeries until after May because of her work does not qualify for short-term disability at this time ?-Rx prednisone Dosepak given for patient to have for any pain or inflammation not received by chronic use of Percocet or Lyrica and advised patient to refrain  from taking meloxicam while she is on this medication ?-Advised patient to return to using her cam boot and to also get knee scooter as I have prescribed this visit to stay off the left foot and ankle to protect the Achilles from further injury until she can get her MRI and receive further instructions on care from me ?-Work note provided to allow light duty and additional 30-minute break and use of cam boot and knee scooter while at work ?-Patient to return to office after MRI or sooner problems or issues arise ?Asencion Islam, DPM ? ?

## 2021-10-03 ENCOUNTER — Telehealth: Payer: Self-pay | Admitting: *Deleted

## 2021-10-03 NOTE — Telephone Encounter (Signed)
Called and left a message for the patient stating that I have faxed over the needed information to the insurance company and I am waiting on prior authorization for MRI. Misty Stanley ?

## 2021-10-10 ENCOUNTER — Telehealth: Payer: Self-pay | Admitting: Urology

## 2021-10-10 ENCOUNTER — Telehealth: Payer: Self-pay | Admitting: *Deleted

## 2021-10-10 NOTE — Telephone Encounter (Signed)
Pt called stating she hasn't heard from the MRI center yet to schedule her MRI. So we cxled her appt for tomorrow.  ?

## 2021-10-10 NOTE — Telephone Encounter (Signed)
Called and left a message for the patient and stated that she has an MRI at [facility] on [address] tomorrow April 11th at 12:30 pm and the authorization number is [id number] and the procedure code [id number] was approved and is valid from 09-28-2021 thru 03-27-2022. Misty Stanley ?

## 2021-10-10 NOTE — Telephone Encounter (Signed)
Patient has an appointment tomorrow at [facility] imaging at 12:30 pm. Stacey Stewart ?

## 2021-10-11 ENCOUNTER — Ambulatory Visit: Payer: Managed Care, Other (non HMO) | Admitting: Sports Medicine

## 2021-10-12 ENCOUNTER — Telehealth: Payer: Self-pay | Admitting: *Deleted

## 2021-10-12 NOTE — Telephone Encounter (Signed)
Patient called today and left a message and I was returning patient's call but I had to leave a message. Stacey Stewart ?

## 2021-10-14 ENCOUNTER — Other Ambulatory Visit: Payer: Self-pay | Admitting: Orthopaedic Surgery

## 2021-10-14 DIAGNOSIS — M5416 Radiculopathy, lumbar region: Secondary | ICD-10-CM

## 2021-10-14 DIAGNOSIS — M4316 Spondylolisthesis, lumbar region: Secondary | ICD-10-CM

## 2021-10-21 ENCOUNTER — Ambulatory Visit
Admission: RE | Admit: 2021-10-21 | Discharge: 2021-10-21 | Disposition: A | Discharge status: Home / Self Care | Payer: Managed Care, Other (non HMO) | Source: Ambulatory Visit | Attending: Orthopaedic Surgery | Admitting: Orthopaedic Surgery

## 2021-10-21 DIAGNOSIS — M5416 Radiculopathy, lumbar region: Secondary | ICD-10-CM

## 2021-10-21 DIAGNOSIS — M4316 Spondylolisthesis, lumbar region: Secondary | ICD-10-CM

## 2021-10-25 ENCOUNTER — Telehealth: Payer: Self-pay | Admitting: Sports Medicine

## 2021-10-25 NOTE — Telephone Encounter (Signed)
Brief telephone note ? ?Phone call made to patient.  Discussed MRI results of her left ankle which reveals a tiny intrasubstance tear of the Achilles at the distal insertion no full-thickness no high-grade tear or no rupture.  Advised patient at this time to continue with nonweightbearing for an additional month and use of cam boot to protect the area and continue with rest ice and elevation and work restrictions of using the scooter while at work and an additional 30-minute break if needed to rest her left heel.  Advised patient to get a checkup with Dr. Ralene Cork at the end of May to reevaluate her Achilles intrasubstance tear.  Advised patient that as long as she continues with protection and immobilization this tear should heal however if pain still is the same or fails to continue to improve after an additional month of immobilization may benefit from evaluation for surgery however at this time we will continue with conservative care. ? ?Dr. Asencion Islam ?[facility] ?[phone] office ? ?

## 2021-10-26 ENCOUNTER — Telehealth: Payer: Self-pay | Admitting: *Deleted

## 2021-10-26 NOTE — Telephone Encounter (Signed)
-----   Message from Asencion Islam, [place] sent at 10/24/2021  6:02 PM EDT ----- ?Regarding: RE: ?Will you call [facility] and have the report faxed to me? Since I haven?t seen the results yet ?----- Message ----- ?From: Lanney Gins, PMAC ?Sent: 10/24/2021   3:09 PM EDT ?To: Asencion Islam, DPM ? ?Patient called to see if the MRI results were ready. Misty Stanley ? ?

## 2021-10-26 NOTE — Telephone Encounter (Signed)
Dr Marylene Land spoke with the patient yesterday and went over the MRI results. Misty Stanley ?

## 2021-11-29 ENCOUNTER — Ambulatory Visit: Payer: Commercial Managed Care - HMO | Admitting: Sports Medicine

## 2021-11-29 ENCOUNTER — Encounter: Payer: Self-pay | Admitting: Sports Medicine

## 2021-11-29 DIAGNOSIS — M7660 Achilles tendinitis, unspecified leg: Secondary | ICD-10-CM

## 2021-11-29 DIAGNOSIS — S86012D Strain of left Achilles tendon, subsequent encounter: Secondary | ICD-10-CM | POA: Diagnosis not present

## 2021-11-29 DIAGNOSIS — M722 Plantar fascial fibromatosis: Secondary | ICD-10-CM | POA: Diagnosis not present

## 2021-11-29 DIAGNOSIS — G8929 Other chronic pain: Secondary | ICD-10-CM

## 2021-11-29 DIAGNOSIS — M79672 Pain in left foot: Secondary | ICD-10-CM

## 2021-11-29 MED ORDER — DICLOFENAC EPOLAMINE 1.3 % EX PTCH: 1.0000 | MEDICATED_PATCH | Freq: Two times a day (BID) | CUTANEOUS | 1 refills | Status: DC

## 2021-11-29 NOTE — Progress Notes (Signed)
Subjective: Stacey Stewart is a 56 y.o. female patient who presents to office for follow up left heel pain. States that she still has pain and bruise at back of left heel; boot hurts; back hurts and is currently in PT.   Patient Active Problem List   Diagnosis Date Noted   Benign paroxysmal positional vertigo of right ear 11/12/2019   Dyslipidemia 11/12/2019   Hot flashes due to menopause 11/12/2019   Short-term memory loss 11/12/2019   Chronic bilateral low back pain with left-sided sciatica 11/15/2016   Cigarette nicotine dependence without complication 11/15/2016   Tobacco abuse 11/15/2016   Anxiety 08/06/2014   History of cerebral hemorrhage 08/06/2014   Migraine without aura and without status migrainosus, not intractable 08/06/2014   Von Willebrand disease (HCC) 08/06/2014   Acute headache 08/06/2014   Balance disorder 08/05/2014    Current Outpatient Medications on File Prior to Visit  Medication Sig Dispense Refill   albuterol (VENTOLIN HFA) 108 (90 Base) MCG/ACT inhaler Inhale into the lungs.     buPROPion (WELLBUTRIN XL) 300 MG 24 hr tablet Take 1 tablet by mouth daily.     buPROPion (ZYBAN) 150 MG 12 hr tablet Take by mouth.     butalbital-acetaminophen-caffeine (FIORICET WITH CODEINE) 50-325-40-30 MG capsule Take by mouth.     desmopressin (DDAVP NASAL) 0.01 % solution SMARTSIG:1 Spray(s) Both Nares Daily PRN     desmopressin (STIMATE) 1.5 MG/ML SOLN Place 1 spray (0.15 mg total) into the nose daily as needed. 1 spray into EACH NOSTRIL as neede 2.5 mL 3   diclofenac Sodium (VOLTAREN) 1 % GEL Apply 4 g topically 4 (four) times daily. 150 g 1   Gabapentin, Once-Daily, 300 & 600 MG MISC Take by mouth.     LAGEVRIO 200 MG CAPS capsule Take 4 capsules by mouth 2 (two) times daily.     meclizine (ANTIVERT) 25 MG tablet Take by mouth.     meloxicam (MOBIC) 15 MG tablet TAKE 1/2 TO 1 TABLET BY MOUTH EVERY DAY AS NEEDED FOR PAIN     methocarbamol (ROBAXIN) 500 MG tablet Take by mouth.      morphine (MS CONTIN) 15 MG 12 hr tablet Take by mouth.     mupirocin ointment (BACTROBAN) 2 % Apply topically 2 (two) times daily.     ondansetron (ZOFRAN-ODT) 4 MG disintegrating tablet Take by mouth.     oxyCODONE-acetaminophen (PERCOCET) 10-325 MG tablet Take 1 tablet by mouth every 6 (six) hours as needed.     OZEMPIC, 0.25 OR 0.5 MG/DOSE, 2 MG/3ML SOPN Inject 0.5 mg into the skin once a week.     predniSONE (STERAPRED UNI-PAK 21 TAB) 10 MG (21) TBPK tablet Take as directed 21 tablet 0   pregabalin (LYRICA) 75 MG capsule Take by mouth.     rizatriptan (MAXALT) 10 MG tablet Take by mouth.     rosuvastatin (CRESTOR) 20 MG tablet Take by mouth.     sertraline (ZOLOFT) 100 MG tablet TAKE 1 TABLET(100 MG) BY MOUTH DAILY     tiZANidine (ZANAFLEX) 4 MG tablet Take 1 tablet by mouth as needed.     Topiramate ER (TROKENDI XR) 200 MG CP24 Take by mouth.     valACYclovir (VALTREX) 1000 MG tablet Take 1,000 mg by mouth 3 (three) times daily.     varenicline (CHANTIX) 1 MG tablet Take by mouth.     Current Facility-Administered Medications on File Prior to Visit  Medication Dose Route Frequency Provider Last Rate Last Admin  triamcinolone acetonide (KENALOG) 10 MG/ML injection 10 mg  10 mg Other Once Asencion Islam, DPM        Allergies  Allergen Reactions   Aspirin Other (See Comments)    Von Willebrand Von Willebrand Von Willebrand Von Willebrand Von Willebrand Von Willebrand     Objective:  General: Alert and oriented x3 in no acute distress  Dermatology: No open lesions bilateral lower extremities, no webspace macerations, mild ecchymosis to left heel, achilles insertion,  all nails x 10 are well manicured.  Vascular: Dorsalis Pedis and Posterior Tibial pedal pulses 1/4, Capillary Fill Time 3 seconds, + pedal hair growth bilateral, no edema bilateral lower extremities, Temperature gradient within normal limits.  Neurology: Michaell Cowing sensation intact via light touch  bilateral.  Musculoskeletal: Mild tenderness diffusely across the Achilles insertion on the left heel, there is also mild pain with palpation to the central plantar and medial calcaneal tubercle at the plantar fascial insertion on the left heel, the Achilles tendon is palpable along the insertion on the left, Thompson sign is negative on the left.     Assessment and Plan: Problem List Items Addressed This Visit   None Visit Diagnoses     Achilles tendon pain    -  Primary   Relevant Orders   Ambulatory referral to Physical Therapy   Strain of left Achilles tendon, subsequent encounter       Relevant Orders   Ambulatory referral to Physical Therapy   Plantar fasciitis of left foot       Relevant Orders   Ambulatory referral to Physical Therapy   Chronic heel pain, left       Relevant Medications   oxyCODONE-acetaminophen (PERCOCET) 10-325 MG tablet   Other Relevant Orders   Ambulatory referral to Physical Therapy        -Complete examination performed -Discussed continue care for achilles with history of tiny intra substance tear on left achilles on MRI  -Advised patient to return to slowly wean from CAM boot with use of heel lifts but to use scooter for longer distances -Rx for PT at [facility] [place] -Rx Flector patches -Recommend rest, ice, elevation -Patient to return to office in 1 month for follow up heel pain or sooner problems or issues arise Asencion Islam, DPM

## 2021-12-26 ENCOUNTER — Ambulatory Visit: Payer: Commercial Managed Care - HMO | Admitting: Podiatry

## 2021-12-26 ENCOUNTER — Encounter: Payer: Self-pay | Admitting: Podiatry

## 2021-12-26 DIAGNOSIS — M7662 Achilles tendinitis, left leg: Secondary | ICD-10-CM | POA: Diagnosis not present

## 2021-12-26 DIAGNOSIS — M7660 Achilles tendinitis, unspecified leg: Secondary | ICD-10-CM | POA: Diagnosis not present

## 2022-03-14 ENCOUNTER — Ambulatory Visit: Payer: Commercial Managed Care - HMO | Admitting: Podiatry

## 2022-03-28 ENCOUNTER — Ambulatory Visit: Payer: Commercial Managed Care - HMO | Admitting: Podiatry

## 2022-03-28 DIAGNOSIS — M7662 Achilles tendinitis, left leg: Secondary | ICD-10-CM | POA: Diagnosis not present

## 2022-03-28 MED ORDER — METHYLPREDNISOLONE 4 MG PO TBPK: ORAL_TABLET | ORAL | 0 refills | Status: DC

## 2022-03-28 NOTE — Patient Instructions (Signed)
Achilles Tendinitis  with Rehab Achilles tendinitis is a disorder of the Achilles tendon. The Achilles tendon connects the large calf muscles (Gastrocnemius and Soleus) to the heel bone (calcaneus). This tendon is sometimes called the heel cord. It is important for pushing-off and standing on your toes and is important for walking, running, or jumping. Tendinitis is often caused by overuse and repetitive microtrauma. SYMPTOMS  Pain, tenderness, swelling, warmth, and redness may occur over the Achilles tendon even at rest.  Pain with pushing off, or flexing or extending the ankle.  Pain that is worsened after or during activity. CAUSES   Overuse sometimes seen with rapid increase in exercise programs or in sports requiring running and jumping.  Poor physical conditioning (strength and flexibility or endurance).  Running sports, especially training running down hills.  Inadequate warm-up before practice or play or failure to stretch before participation.  Injury to the tendon. PREVENTION   Warm up and stretch before practice or competition.  Allow time for adequate rest and recovery between practices and competition.  Keep up conditioning.  Keep up ankle and leg flexibility.  Improve or keep muscle strength and endurance.  Improve cardiovascular fitness.  Use proper technique.  Use proper equipment (shoes, skates).  To help prevent recurrence, taping, protective strapping, or an adhesive bandage may be recommended for several weeks after healing is complete. PROGNOSIS   Recovery may take weeks to several months to heal.  Longer recovery is expected if symptoms have been prolonged.  Recovery is usually quicker if the inflammation is due to a direct blow as compared with overuse or sudden strain. RELATED COMPLICATIONS   Healing time will be prolonged if the condition is not correctly treated. The injury must be given plenty of time to heal.  Symptoms can reoccur if  activity is resumed too soon.  Untreated, tendinitis may increase the risk of tendon rupture requiring additional time for recovery and possibly surgery. TREATMENT   The first treatment consists of rest anti-inflammatory medication, and ice to relieve the pain.  Stretching and strengthening exercises after resolution of pain will likely help reduce the risk of recurrence. Referral to a physical therapist or athletic trainer for further evaluation and treatment may be helpful.  A walking boot or cast may be recommended to rest the Achilles tendon. This can help break the cycle of inflammation and microtrauma.  Arch supports (orthotics) may be prescribed or recommended by your caregiver as an adjunct to therapy and rest.  Surgery to remove the inflamed tendon lining or degenerated tendon tissue is rarely necessary and has shown less than predictable results. MEDICATION   Nonsteroidal anti-inflammatory medications, such as aspirin and ibuprofen, may be used for pain and inflammation relief. Do not take within 7 days before surgery. Take these as directed by your caregiver. Contact your caregiver immediately if any bleeding, stomach upset, or signs of allergic reaction occur. Other minor pain relievers, such as acetaminophen, may also be used.  Pain relievers may be prescribed as necessary by your caregiver. Do not take prescription pain medication for longer than 4 to 7 days. Use only as directed and only as much as you need. HEAT AND COLD  Cold is used to relieve pain and reduce inflammation for acute and chronic Achilles tendinitis. Cold should be applied for 10 to 15 minutes every 2 to 3 hours for inflammation and pain and immediately after any activity that aggravates your symptoms. Use ice packs or an ice massage.  Heat may be used   before performing stretching and strengthening activities prescribed by your caregiver. Use a heat pack or a warm soak. SEEK MEDICAL CARE IF:  Symptoms get  worse or do not improve in 2 weeks despite treatment.  New, unexplained symptoms develop. Drugs used in treatment may produce side effects.   EXERCISES-- hold each stretch for 30 seconds and repeat 10 times.  Complete each stretch 3 times per day.   RANGE OF MOTION (ROM) AND STRETCHING EXERCISES - Achilles Tendinitis  These exercises may help you when beginning to rehabilitate your injury. Your symptoms may resolve with or without further involvement from your physician, physical therapist or athletic trainer. While completing these exercises, remember:   Restoring tissue flexibility helps normal motion to return to the joints. This allows healthier, less painful movement and activity.  An effective stretch should be held for at least 30 seconds.  A stretch should never be painful. You should only feel a gentle lengthening or release in the stretched tissue. STRETCH  Gastroc, Standing   Place hands on wall.  Extend right / left leg, keeping the front knee somewhat bent.  Slightly point your toes inward on your back foot.  Keeping your right / left heel on the floor and your knee straight, shift your weight toward the wall, not allowing your back to arch.  You should feel a gentle stretch in the right / left calf. Hold this position for __________ seconds. Repeat __________ times. Complete this stretch __________ times per day. STRETCH  Soleus, Standing   Place hands on wall.  Extend right / left leg, keeping the other knee somewhat bent.  Slightly point your toes inward on your back foot.  Keep your right / left heel on the floor, bend your back knee, and slightly shift your weight over the back leg so that you feel a gentle stretch deep in your back calf.  Hold this position for __________ seconds. Repeat __________ times. Complete this stretch __________ times per day. STRETCH  Gastrocsoleus, Standing  Note: This exercise can place a lot of stress on your foot and ankle.  Please complete this exercise only if specifically instructed by your caregiver.   Place the ball of your right / left foot on a step, keeping your other foot firmly on the same step.  Hold on to the wall or a rail for balance.  Slowly lift your other foot, allowing your body weight to press your heel down over the edge of the step.  You should feel a stretch in your right / left calf.  Hold this position for __________ seconds.  Repeat this exercise with a slight bend in your knee. Repeat __________ times. Complete this stretch __________ times per day.  STRENGTHENING EXERCISES - Achilles Tendinitis These exercises may help you when beginning to rehabilitate your injury. They may resolve your symptoms with or without further involvement from your physician, physical therapist or athletic trainer. While completing these exercises, remember:   Muscles can gain both the endurance and the strength needed for everyday activities through controlled exercises.  Complete these exercises as instructed by your physician, physical therapist or athletic trainer. Progress the resistance and repetitions only as guided.  You may experience muscle soreness or fatigue, but the pain or discomfort you are trying to eliminate should never worsen during these exercises. If this pain does worsen, stop and make certain you are following the directions exactly. If the pain is still present after adjustments, discontinue the exercise until you can discuss   the trouble with your clinician. STRENGTH - Plantar-flexors   Sit with your right / left leg extended. Holding onto both ends of a rubber exercise band/tubing, loop it around the ball of your foot. Keep a slight tension in the band.  Slowly push your toes away from you, pointing them downward.  Hold this position for __________ seconds. Return slowly, controlling the tension in the band/tubing. Repeat __________ times. Complete this exercise __________ times  per day.  STRENGTH - Plantar-flexors   Stand with your feet shoulder width apart. Steady yourself with a wall or table using as little support as needed.  Keeping your weight evenly spread over the width of your feet, rise up on your toes.*  Hold this position for __________ seconds. Repeat __________ times. Complete this exercise __________ times per day.  *If this is too easy, shift your weight toward your right / left leg until you feel challenged. Ultimately, you may be asked to do this exercise with your right / left foot only. STRENGTH  Plantar-flexors, Eccentric  Note: This exercise can place a lot of stress on your foot and ankle. Please complete this exercise only if specifically instructed by your caregiver.   Place the balls of your feet on a step. With your hands, use only enough support from a wall or rail to keep your balance.  Keep your knees straight and rise up on your toes.  Slowly shift your weight entirely to your right / left toes and pick up your opposite foot. Gently and with controlled movement, lower your weight through your right / left foot so that your heel drops below the level of the step. You will feel a slight stretch in the back of your calf at the end position.  Use the healthy leg to help rise up onto the balls of both feet, then lower weight only on the right / left leg again. Build up to 15 repetitions. Then progress to 3 consecutive sets of 15 repetitions.*  After completing the above exercise, complete the same exercise with a slight knee bend (about 30 degrees). Again, build up to 15 repetitions. Then progress to 3 consecutive sets of 15 repetitions.* Perform this exercise __________ times per day.  *When you easily complete 3 sets of 15, your physician, physical therapist or athletic trainer may advise you to add resistance by wearing a backpack filled with additional weight. STRENGTH - Plantar Flexors, Seated   Sit on a chair that allows your feet  to rest flat on the ground. If necessary, sit at the edge of the chair.  Keeping your toes firmly on the ground, lift your right / left heel as far as you can without increasing any discomfort in your ankle. Repeat __________ times. Complete this exercise __________ times a day. *If instructed by your physician, physical therapist or athletic trainer, you may add ____________________ of resistance by placing a weighted object on your right / left knee. Document Released: 01/18/2005 Document Revised: 09/11/2011 Document Reviewed: 10/01/2008 [employer] Patient Information 2014 [employer].    

## 2022-03-28 NOTE — Progress Notes (Unsigned)
  Subjective:  Patient ID: Stacey Stewart, female    DOB: 1965/12/26,  MRN: [id number]  Chief Complaint  Patient presents with   Foot Problem    Spur on left foot- X-rays were taking on 08/2021    56 y.o. female presents with  ***  Past Medical History:  Diagnosis Date   Depression    Hyperlipidemia    TBI (traumatic brain injury) ([facility])    Von Willebrand disease ([facility])     Allergies  Allergen Reactions   Aspirin Other (See Comments)    Von Willebrand Von Willebrand Von Willebrand Von Willebrand Von Willebrand Von Willebrand     ROS: Negative except as per HPI above  Objective:  General: AAO x3, NAD  Dermatological: With inspection and palpation of the right and left lower extremities there are no open sores, no preulcerative lesions, no rash or signs of infection present. Nails are of normal length thickness and coloration.   Vascular:  Dorsalis Pedis artery and Posterior Tibial artery pedal pulses are 2/4 bilateral.  Capillary fill time brisk < 3 sec. Pedal hair growth present. No varicosities and no lower extremity edema present bilateral. There is no pain with calf compression, swelling, warmth, erythema.   Neruologic: Grossly intact via light touch bilateral. Protective threshold intact to all sites bilateral. Patellar and Achilles deep tendon reflexes 2+ bilateral. Negative Babinski reflex.   Musculoskeletal: No gross boney pedal deformities bilateral. No pain, crepitus, or limitation noted with foot and ankle range of motion bilateral. Muscular strength 5/5 in all groups tested bilateral.  Gait: Unassisted, Nonantalgic.   No images are attached to the encounter.  Radiographs:  Date: *** XR {right/left foot:16097} Weightbearing AP/Lateral/Oblique   Findings: {MP Foot XR:23762::"no fracture, dislocation, swelling or degenerative changes noted"} Assessment:   1. Achilles tendinitis, left leg      Plan:  Patient was evaluated and treated and all questions  answered.  #*** -***  Return if symptoms worsen or fail to improve.          Everitt Amber, DPM [facility]

## 2022-04-03 ENCOUNTER — Telehealth: Payer: Self-pay

## 2022-04-03 NOTE — Telephone Encounter (Signed)
Pt calls to report that she has noticed some bruising and blood under her skin on her left arm. "I am covering it with makeup because it looks bad". Pt has hx von Willebrand disease. Dr Bobby Rumpf saw her once to give her prescription for Stimate and turned her care back over to her PCP. Pt instructed to call her PCP for lab appt, & instructions. If PCP thinks she needs to be seen by Dr Bobby Rumpf again, she will contact us. Pt verbalized understanding.     12/07/2020 ASSESSMENT & PLAN:  A 56 y.o. female who I was asked to consult upon for type I von Willebrand disease.  Her labs today clearly reflect this being the case.  Fortunately, she has not had any recent complications from this disease.  I have no problem prescribing her Stimate nasal spray to protect her against any potential bleeding problems, particularly if any procedures or surgeries are needed in her future.  As she has no pressing issues, I will turn her care back over to her primary care office.  I would have no problem seeing her in the future if she develops complications related to her von Willebrand disease or other hematologic issues that require repeat clinical assessment.  The patient understands all the plans discussed today and is in agreement with them.   I do appreciate Dr Serita Grammes for his new consult.    Dequincy Macarthur Critchley, MD  22-

## 2022-05-01 ENCOUNTER — Encounter: Payer: Managed Care, Other (non HMO) | Admitting: Oncology

## 2022-05-01 ENCOUNTER — Other Ambulatory Visit: Payer: Managed Care, Other (non HMO)

## 2022-05-10 NOTE — Progress Notes (Incomplete)
[facility]  [address] [place],  [place]  [address] [phone]  Clinic Day:  12/27/2020  Referring physician: Dr Buckner Malta   HISTORY OF PRESENT ILLNESS:  The patient is a 56 y.o. female who I was asked to consult upon for having von Willebrand disease.  According to the patient, she was diagnosed with type I von Willebrand disease at 56 years old after having excessive bleeding from wisdom teeth extraction.  She recalls having severe nosebleeds as a child.  She also recalls having heavy menstrual cycles to where ablation of her uterine lining was done 20+ years ago.  Of note, her maternal grandfather, mother, sister, multiple 1st cousins and daughter all have von Willebrand disease.  Fortunately, the patient has not had any recent bleeding/bruising problems.  The patient has used Stimate in the past to prevent perioperative bleeding complications.  She essentially comes in today to get a prescription for Stimate.  PAST MEDICAL HISTORY:   Past Medical History:  Diagnosis Date  . Depression   . Hyperlipidemia   . TBI (traumatic brain injury) ([facility])   . Von Willebrand disease ([facility])     PAST SURGICAL HISTORY:   Past Surgical History:  Procedure Laterality Date  . BRAIN SURGERY    . TRACHEOSTOMY CLOSURE    . TRACHEOSTOMY TUBE PLACEMENT    . UTERINE ABLATION    . WISDOM TOOTH EXTRACTION      CURRENT MEDICATIONS:   Current Outpatient Medications  Medication Sig Dispense Refill  . albuterol (VENTOLIN HFA) 108 (90 Base) MCG/ACT inhaler Inhale into the lungs.    Marland Kitchen buPROPion (WELLBUTRIN XL) 300 MG 24 hr tablet Take 1 tablet by mouth daily.    Marland Kitchen buPROPion (ZYBAN) 150 MG 12 hr tablet Take by mouth.    . butalbital-acetaminophen-caffeine (FIORICET WITH CODEINE) 50-325-40-30 MG capsule Take by mouth.    . desmopressin (DDAVP NASAL) 0.01 % solution SMARTSIG:1 Spray(s) Both Nares Daily PRN    . desmopressin (STIMATE) 1.5 MG/ML SOLN Place 1 spray  (0.15 mg total) into the nose daily as needed. 1 spray into EACH NOSTRIL as neede 2.5 mL 3  . diclofenac (FLECTOR) 1.3 % PTCH Place 1 patch onto the skin 2 (two) times daily. 60 patch 1  . diclofenac Sodium (VOLTAREN) 1 % GEL Apply 4 g topically 4 (four) times daily. 150 g 1  . Gabapentin, Once-Daily, 300 & 600 MG MISC Take by mouth.    Marland Kitchen LAGEVRIO 200 MG CAPS capsule Take 4 capsules by mouth 2 (two) times daily.    . meclizine (ANTIVERT) 25 MG tablet Take by mouth.    . meloxicam (MOBIC) 15 MG tablet TAKE 1/2 TO 1 TABLET BY MOUTH EVERY DAY AS NEEDED FOR PAIN    . methocarbamol (ROBAXIN) 500 MG tablet Take by mouth.    . methylPREDNISolone (MEDROL DOSEPAK) 4 MG TBPK tablet Take as directed for 6 days 1 each 0  . morphine (MS CONTIN) 15 MG 12 hr tablet Take by mouth.    . mupirocin ointment (BACTROBAN) 2 % Apply topically 2 (two) times daily.    . ondansetron (ZOFRAN-ODT) 4 MG disintegrating tablet Take by mouth.    . oxyCODONE-acetaminophen (PERCOCET) 10-325 MG tablet Take 1 tablet by mouth every 6 (six) hours as needed.    Marland Kitchen OZEMPIC, 0.25 OR 0.5 MG/DOSE, 2 MG/3ML SOPN Inject 0.5 mg into the skin once a week.    . predniSONE (STERAPRED UNI-PAK 21 TAB) 10 MG (21) TBPK tablet  Take as directed 21 tablet 0  . pregabalin (LYRICA) 75 MG capsule Take by mouth.    . rizatriptan (MAXALT) 10 MG tablet Take by mouth.    . rosuvastatin (CRESTOR) 20 MG tablet Take by mouth.    . sertraline (ZOLOFT) 100 MG tablet TAKE 1 TABLET(100 MG) BY MOUTH DAILY    . tiZANidine (ZANAFLEX) 4 MG tablet Take 1 tablet by mouth as needed.    . Topiramate ER (TROKENDI XR) 200 MG CP24 Take by mouth.    . valACYclovir (VALTREX) 1000 MG tablet Take 1,000 mg by mouth 3 (three) times daily.    . varenicline (CHANTIX) 1 MG tablet Take by mouth.     Current Facility-Administered Medications  Medication Dose Route Frequency Provider Last Rate Last Admin  . triamcinolone acetonide (KENALOG) 10 MG/ML injection 10 mg  10 mg Other Once  Asencion Islam, DPM        ALLERGIES:   Allergies  Allergen Reactions  . Aspirin Other (See Comments)    Von Willebrand Von Willebrand Von Willebrand Von Willebrand Von Willebrand Von Willebrand     FAMILY HISTORY:   Family History  Problem Relation Age of Onset  . COPD Father   . Von Willebrand disease Sister   . Breast cancer Maternal Grandmother     SOCIAL HISTORY:  The patient was born in [place]  [place]  She is divorced, with 3 children.  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  She has smoked a half a pack of cigarettes daily x 37 years.  There is no history of alcohol abuse.    REVIEW OF SYSTEMS:  Review of Systems  Constitutional:  Negative for fatigue and fever.       Night sweats  HENT:   Negative for hearing loss and sore throat.   Eyes:  Negative for eye problems.  Respiratory:  Negative for chest tightness, cough and hemoptysis.   Cardiovascular:  Negative for chest pain and palpitations.  Gastrointestinal:  Negative for abdominal distention, abdominal pain, blood in stool, constipation, diarrhea, nausea and vomiting.  Endocrine: Negative for hot flashes.  Genitourinary:  Negative for difficulty urinating, dysuria, frequency, hematuria and nocturia.   Musculoskeletal:  Positive for arthralgias. Negative for back pain, gait problem and myalgias.  Skin: Negative.  Negative for itching and rash.  Neurological:  Positive for headaches. Negative for dizziness, extremity weakness, gait problem, light-headedness and numbness.  Hematological: Negative.   Psychiatric/Behavioral:  Positive for depression. Negative for suicidal ideas. The patient is nervous/anxious.      PHYSICAL EXAM:  There were no vitals taken for this visit. Wt Readings from Last 3 Encounters:  12/27/20 176 lb 11.2 oz (80.2 kg)   There is no height or weight on file to calculate BMI. Performance status (ECOG): 0 - Asymptomatic Physical  Exam Constitutional:      Appearance: Normal appearance. She is not ill-appearing.  HENT:     Mouth/Throat:     Mouth: Mucous membranes are moist.     Pharynx: Oropharynx is clear. No oropharyngeal exudate or posterior oropharyngeal erythema.  Cardiovascular:     Rate and Rhythm: Normal rate and regular rhythm.     Heart sounds: No murmur heard.    No friction rub. No gallop.  Pulmonary:     Effort: Pulmonary effort is normal. No respiratory distress.     Breath sounds: Normal breath sounds. No wheezing, rhonchi or rales.  Abdominal:  General: Bowel sounds are normal. There is no distension.     Palpations: Abdomen is soft. There is no mass.     Tenderness: There is no abdominal tenderness.  Musculoskeletal:        General: No swelling.     Right lower leg: No edema.     Left lower leg: No edema.  Lymphadenopathy:     Cervical: No cervical adenopathy.     Upper Body:     Right upper body: No supraclavicular or axillary adenopathy.     Left upper body: No supraclavicular or axillary adenopathy.     Lower Body: No right inguinal adenopathy. No left inguinal adenopathy.  Skin:    General: Skin is warm.     Coloration: Skin is not jaundiced.     Findings: No lesion or rash.  Neurological:     General: No focal deficit present.     Mental Status: She is alert and oriented to person, place, and time. Mental status is at baseline.  Psychiatric:        Mood and Affect: Mood normal.        Behavior: Behavior normal.        Thought Content: Thought content normal.   LABS:      Latest Ref Rng & Units 12/27/2020   12:00 AM  CBC  WBC  5.2   Hemoglobin 12.0 - 16.0 13.0   Hematocrit 36 - 46 38   Platelets 150 - 399 124       Latest Ref Rng & Units 12/27/2020   12:00 AM  CMP  BUN 4 - 21 8   Creatinine 0.5 - 1.1 0.7   Sodium 137 - 147 141   Potassium 3.4 - 5.3 3.9   Chloride 99 - 108 110   CO2 13 - 22 23   Calcium 8.7 - 10.7 8.7   Alkaline Phos 25 - 125 61   AST 13 - 35  21   ALT 7 - 35 17      ASSESSMENT & PLAN:  A 56 y.o. female who I was asked to consult upon for type I von Willebrand disease.  Her labs today clearly reflect this being the case.  Fortunately, she has not had any recent complications from this disease.  I have no problem prescribing her Stimate nasal spray to protect her against any potential bleeding problems, particularly if any procedures or surgeries are needed in her future.  As she has no pressing issues, I will turn her care back over to her primary care office.  I would have no problem seeing her in the future if she develops complications related to her von Willebrand disease or other hematologic issues that require repeat clinical assessment.  The patient understands all the plans discussed today and is in agreement with them.  I do appreciate Dr Buckner Malta for his new consult.   Grey Rakestraw Kirby Funk, MD

## 2022-05-11 ENCOUNTER — Inpatient Hospital Stay: Payer: Commercial Managed Care - HMO

## 2022-05-11 ENCOUNTER — Inpatient Hospital Stay: Payer: Commercial Managed Care - HMO | Attending: Oncology | Admitting: Oncology

## 2022-05-18 ENCOUNTER — Inpatient Hospital Stay: Payer: Commercial Managed Care - HMO | Admitting: Oncology

## 2022-05-18 VITALS — BP 111/69 | HR 80 | Temp 98.5°F | Wt 169.6 lb

## 2022-05-18 DIAGNOSIS — D6800 Von Willebrand disease, unspecified: Secondary | ICD-10-CM | POA: Diagnosis not present

## 2022-05-18 NOTE — Progress Notes (Signed)
[facility]  [address] [place],  [place]  [address] [phone]  Clinic Day:  05/18/2022  Referring physician: Dr Buckner Malta  HISTORY OF PRESENT ILLNESS:  The patient is a 56 y.o. female with type I von Willebrand disease.  This was proven per labs done in June 2022.  She comes in today as she has had recent issues with gingival bleeding.  She also has noticed slightly more bruising over her bilateral forearms.  Of note, the patient claims she used to estimate nasal spray approximately 2 weeks ago when the gingival bleeding became an issue.  She denies having diffuse bruising over the rest of her body.  In fact, the bilateral forearm bruising has improved over the past few weeks.  She denies having any significant bleeding/bruising complications over the past year.  PHYSICAL EXAM:  Blood pressure 111/69, pulse 80, temperature 98.5 F (36.9 C), weight 169 lb 9.6 oz (76.9 kg). Wt Readings from Last 3 Encounters:  05/18/22 169 lb 9.6 oz (76.9 kg)  12/27/20 176 lb 11.2 oz (80.2 kg)   Body mass index is 28.57 kg/m. Performance status (ECOG): 0 - Asymptomatic Physical Exam Constitutional:      Appearance: Normal appearance. She is not ill-appearing.  HENT:     Mouth/Throat:     Mouth: Mucous membranes are moist.     Pharynx: Oropharynx is clear. No oropharyngeal exudate or posterior oropharyngeal erythema.  Cardiovascular:     Rate and Rhythm: Normal rate and regular rhythm.     Heart sounds: No murmur heard.    No friction rub. No gallop.  Pulmonary:     Effort: Pulmonary effort is normal. No respiratory distress.     Breath sounds: Normal breath sounds. No wheezing, rhonchi or rales.  Abdominal:     General: Bowel sounds are normal. There is no distension.     Palpations: Abdomen is soft. There is no mass.     Tenderness: There is no abdominal tenderness.  Musculoskeletal:        General: No swelling.     Right lower leg: No edema.      Left lower leg: No edema.  Lymphadenopathy:     Cervical: No cervical adenopathy.     Upper Body:     Right upper body: No supraclavicular or axillary adenopathy.     Left upper body: No supraclavicular or axillary adenopathy.     Lower Body: No right inguinal adenopathy. No left inguinal adenopathy.  Skin:    General: Skin is warm.     Coloration: Skin is not jaundiced.     Findings: Bruising (minimal bruising seen over her left forearm) present. No lesion or rash.  Neurological:     General: No focal deficit present.     Mental Status: She is alert and oriented to person, place, and time. Mental status is at baseline.  Psychiatric:        Mood and Affect: Mood normal.        Behavior: Behavior normal.        Thought Content: Thought content normal.    LABS:  Von Willebrand Studies from June 2022 revealed the following:   Component Value Flag Ref Range Units Status  Coagulation Factor VIII 38   Low   56 - 140 % Final  Comment:  (NOTE)  FVIII activity is decreased in individuals with hemophilia A, in  carriers of hemophilia A or in those with type VWD. Levels will  also  be decreased with acquired hemophilia or acquired von  Willebrand Syndrome. A spurious decrease in FVIII activity may occur  in the presence of a lupus anticoagulant, heparin, or direct thrombin  or Xa inhibitor anticoagulant therapy.  FVIII is a labile factor and  may decrease if a sample is left at room temperature for prolonged  periods of time. FVIII can also be found in cryoprecipitate and if  a whole blood sample is maintained on ice or refrigerated prior to  processing, FVIII may decrease falsely.   Ristocetin Co-factor, Plasma 15   Low   50 - 200 % Final  Comment:  (NOTE)  **Verified by repeat analysis**  Performed At: [facility]  [address] [place], [place] [id number]  Jolene Schimke MD XY:[phone]   Von Willebrand Antigen, Plasma 25   Low   50 - 200 % Final  Comment:  (NOTE)   This test was developed and its performance characteristics  determined by [facility]. It has not been cleared or approved  by the Food and Drug Administration.    VWF multimer analysis shows a normal pattern and  distribution of bands. Decreased VWF levels (below about  30% according to the current NHLBI guideline) in  combination with a normal multimer pattern are most  consistent with type 1 von Willebrand disease, although  acquired von Willebrand Syndrome cannot be excluded.   ASSESSMENT & PLAN:  A 56 y.o. female with type I von Willebrand disease.  In clinic today, I reviewed her clinical picture with her.  I was not particularly startled with the minimal bruising on her forearms.  Even though she has von Willebrand disease, the bruising over her forearms likely came from trauma.  The fact that she does not have diffuse bodily bruising reassures me that her von Willebrand disease is likely not the culprit behind her forearms.  I do believe her intermittent, nontraumatic gingival bleeding is related to her von Willebrand disease.  For that, I would not have a problem with her using Stimate nasal spray, particularly if her gingival bleeding is pronounced.  Overall, I do not get the sense her von Willebrand disease is causing her any significant problems.  She knows to use her Stimate as needed.  I do feel comfortable turning her care back over to her other physicians.  I would not have a problem seeing her in the future if new bleeding/bruising complications develop that require immediate clinical attention.  The patient understands all the plans discussed today and is in agreement with them.  Philip Kotlyar Kirby Funk, MD

## 2022-07-07 ENCOUNTER — Ambulatory Visit: Payer: Commercial Managed Care - HMO | Admitting: Podiatry

## 2022-08-17 ENCOUNTER — Ambulatory Visit (INDEPENDENT_AMBULATORY_CARE_PROVIDER_SITE_OTHER): Payer: Commercial Managed Care - HMO | Admitting: Orthopedic Surgery

## 2022-08-17 DIAGNOSIS — S86012A Strain of left Achilles tendon, initial encounter: Secondary | ICD-10-CM

## 2022-08-18 ENCOUNTER — Encounter: Payer: Self-pay | Admitting: Orthopedic Surgery

## 2022-08-18 NOTE — Progress Notes (Signed)
Office Visit Note   Patient: Stacey Stewart           Date of Birth: [birth year]           MRN: [id number] Visit Date: 08/17/2022              Requested by: Serita Grammes, MD [address] [place],  [place] [address] PCP: Serita Grammes, MD  Chief Complaint  Patient presents with   Left Ankle - Pain      HPI: Patient is a 57 year old woman who presents with acute Achilles tendon partial rupture.  She is status post Achilles tendon repair remotely.  Patient is currently ambulating full weightbearing with the fracture boot.  Patient states that she wakes up with pain and swelling.  Patient is currently under pain management with her spine surgeon Dr. Patrice Paradise.  Patient has undergone MRI scan of her left Achilles at [facility]: Visit Diagnoses:  1. Rupture of left Achilles tendon, initial encounter     Plan: Patient is placed in a fracture boot with 08/11/2014 since heel lifts.  Will have patient follow-up with Dr. Rolena Infante for evaluation for extracorporal shockwave therapy.  Follow-Up Instructions: No follow-ups on file.   Ortho Exam  Patient is alert, oriented, no adenopathy, well-dressed, normal affect, normal respiratory effort. Examination patient has a good pulse.  Palpation there is no palpable defect of the Achilles no nodular changes.  Compression of the calf reproduces plantarflexion of the foot.  Review of the MRI scan shows partial-thickness edema and tearing mid substance of the Achilles.  Imaging: No results found. No images are attached to the encounter.  Labs: No results found for: "HGBA1C", "ESRSEDRATE", "CRP", "LABURIC", "REPTSTATUS", "GRAMSTAIN", "CULT", "LABORGA"   Lab Results  Component Value Date   ALBUMIN 4.1 12/27/2020    No results found for: "MG" No results found for: "VD25OH"  No results found for: "PREALBUMIN"    Latest Ref Rng & Units 12/27/2020   12:00 AM  CBC EXTENDED  WBC  5.2   RBC 3.87 - 5.11 4.19    Hemoglobin 12.0 - 16.0 13.0   HCT 36 - 46 38   Platelets 150 - 399 124   NEUT#  3.33      There is no height or weight on file to calculate BMI.  Orders:  No orders of the defined types were placed in this encounter.  No orders of the defined types were placed in this encounter.    Procedures: No procedures performed  Clinical Data: No additional findings.  ROS:  All other systems negative, except as noted in the HPI. Review of Systems  Objective: Vital Signs: There were no vitals taken for this visit.  Specialty Comments:  No specialty comments available.  PMFS History: Patient Active Problem List   Diagnosis Date Noted   Benign paroxysmal positional vertigo of right ear 11/12/2019   Dyslipidemia 11/12/2019   Hot flashes due to menopause 11/12/2019   Short-term memory loss 11/12/2019   Chronic bilateral low back pain with left-sided sciatica 11/15/2016   Cigarette nicotine dependence without complication [id number]   Tobacco abuse 11/15/2016   Anxiety 08/06/2014   History of cerebral hemorrhage 08/06/2014   Migraine without aura and without status migrainosus, not intractable 08/06/2014   Von Willebrand disease ([place]) 08/06/2014   Acute headache 08/06/2014   Balance disorder 08/05/2014   Past Medical History:  Diagnosis Date   Depression    Hyperlipidemia    TBI (traumatic  brain injury) ([place])    Von Willebrand disease ([place])     Family History  Problem Relation Age of Onset   COPD Father    Von Willebrand disease Sister    Breast cancer Maternal Grandmother     Past Surgical History:  Procedure Laterality Date   BRAIN SURGERY     TRACHEOSTOMY CLOSURE     TRACHEOSTOMY TUBE PLACEMENT     UTERINE ABLATION     WISDOM TOOTH EXTRACTION     Social History   Occupational History   Occupation: HAIRDRESSER  Tobacco Use   Smoking status: Every Day    Types: Cigarettes   Smokeless tobacco: Never  Substance and Sexual Activity   Alcohol use: Not  Currently   Drug use: Not Currently   Sexual activity: Not Currently

## 2022-08-25 ENCOUNTER — Ambulatory Visit (INDEPENDENT_AMBULATORY_CARE_PROVIDER_SITE_OTHER): Payer: Commercial Managed Care - HMO | Admitting: Sports Medicine

## 2022-08-25 ENCOUNTER — Encounter: Payer: Self-pay | Admitting: Sports Medicine

## 2022-08-25 DIAGNOSIS — S86012S Strain of left Achilles tendon, sequela: Secondary | ICD-10-CM | POA: Diagnosis not present

## 2022-08-25 DIAGNOSIS — M6788 Other specified disorders of synovium and tendon, other site: Secondary | ICD-10-CM | POA: Diagnosis not present

## 2022-08-25 DIAGNOSIS — R2689 Other abnormalities of gait and mobility: Secondary | ICD-10-CM

## 2022-08-25 NOTE — Progress Notes (Signed)
Stacey Stewart - 57 y.o. female MRN [id number]  Date of birth: 27-Mar-1966  Office Visit Note: Visit Date: 08/25/2022 PCP: Pcp, No Referred by: Serita Grammes, MD  Subjective: Chief Complaint  Patient presents with   Left Ankle - Pain   HPI: Stacey Stewart is a pleasant 57 y.o. female who presents today for acute on chronic left Achilles tendon tear and edema.  Back in early April patient felt a pop in the back of the Achilles when she was walking up the hill.  She underwent MRI -  review of MRI report from [facility] radiology from [place] on 10/20/2021 of the left ankle without contrast demonstrates a partial-thickness intrasubstance tear of the Achilles tendon at the distal insertion.  There is edema within its mid to distal Achilles tendon.  No full-thickness tears.  No retraction.  She was treated for this by an outside provider.  She had done formalized physical therapy.  About 1 month ago she was walking again and felt a subsequent pop.  She underwent a new MRI on 07/28/2022, see imaging below.  She has been eating a walking boot with heel lifts since 08/11/2024.  Patient takes oxycodone for pain.  Also use topical Voltaren gel.  Review of podiatry note from 12/26/2021 and 11/30/2018.  Further treatment for her Achilles tendon pain.  Patient did have 2 subsequent corticosteroid injections for heel spurs that she was treated for.  Pertinent ROS were reviewed with the patient and found to be negative unless otherwise specified above in HPI.   Assessment & Plan: Visit Diagnoses:  1. Achilles tendon tear, left, sequela   2. Achilles tendinosis of left lower extremity    Plan: Discussed with Stacey Stewart the nature of her Achilles tendon injury which is an acute exacerbation of her chronic tear and tendinopathy.  We discussed all treatment options such as oral medication therapy, extracorporeal shockwave therapy, and physical therapy.  At this point, patient does a lot of work standing  and we will keep her in the walking boot.  I did remove one of the 9/16 inch heel lifts today in the boot and transition this to the contralateral foot to improve her gait.  Will work on weaning her out of the walking boot over the next few weeks.  We did proceed with a trial of shockwave therapy today, patient tolerated well.  We will repeat this treatment next week and then decide how much improvement she is getting from this modality.  I do not think physical therapy should be started yet but she is still in the acute.,  But we will get her on this as her pain improves.  She may continue her oxycodone 10-325 mg every 6 hours as needed as well as her topical Voltaren gel 1%.  Additional treatment options down the road can be: Meloxicam 15 mg qd, physical therapy, nitroglycerin patch protocol.  Need to be cautious with aspirin-based or anti-inflammatories given her von Willebrand's disease.  Follow-up: Return in about 1 week (around 09/01/2022) for for L-achilles .   Meds & Orders: No orders of the defined types were placed in this encounter.  No orders of the defined types were placed in this encounter.    Procedures: Procedure: ECSWT Indications:  Achilles tendon partial tear, tendinosis   Procedure Details Consent: Risks of procedure as well as the alternatives and risks of each were explained to the patient.  Verbal consent for procedure obtained. Time Out: Verified patient identification, verified procedure, site  was marked, verified correct patient position. The area was cleaned with alcohol swab.     The left achilles tendon was targeted for Extracorporeal shockwave therapy.    Preset: Patellar/Achilles tendinopathy Power Level: 90 mJ Frequency: 10 Hz Impulse/cycles: 2200 Head size: Regular   Patient tolerated procedure well without immediate complications.       Clinical History: No specialty comments available.  She reports that she has been smoking cigarettes. She has never  used smokeless tobacco. No results for input(s): "HGBA1C", "LABURIC" in the last 8760 hours.  Objective:    Physical Exam  Gen: Well-appearing, in no acute distress; non-toxic CV: Well-perfused. Warm.  Resp: Breathing unlabored on room air; no wheezing. Psych: Fluid speech in conversation; appropriate affect; normal thought process Neuro: Sensation intact throughout. No gross coordination deficits.   Ortho Exam - Left ankle: Inspection of the left ankle demonstrates some mild soft tissue swelling in the posterior aspect of the ankle near the mid substance of the Achilles tendon.  There is some slight bogginess here.  No tendon step-off.  There is a very small healing ecchymosis.  There is some restriction in range of motion with dorsiflexion, full range with plantarflexion.  Strength testing not tested today.  Negative Thompson squeeze test. NVI. Mild calf atrophy compared to contralateral leg.  Imaging:  *Independent review of MRI of the left ankle from 07/28/22 was reviewed and interpreted by myself which demonstrates partial-thickness tearing in the mid substance of the Achilles tendon with increased signal with likely fluid in the longitudinal aspect of the tear.  This is about 40 to 50% of the plantar surface of the tendon.  There is some signal change at the distal aspect of the insertion on the calcaneus without evidence of full-thickness tearing.     Past Medical/Family/Surgical/Social History: Medications & Allergies reviewed per EMR, new medications updated. Patient Active Problem List   Diagnosis Date Noted   Benign paroxysmal positional vertigo of right ear 11/12/2019   Dyslipidemia 11/12/2019   Hot flashes due to menopause 11/12/2019   Short-term memory loss 11/12/2019   Chronic bilateral low back pain with left-sided sciatica 11/15/2016   Cigarette nicotine dependence without complication [id number]   Tobacco abuse 11/15/2016   Anxiety 08/06/2014   History of cerebral  hemorrhage 08/06/2014   Migraine without aura and without status migrainosus, not intractable 08/06/2014   Von Willebrand disease ([place]) 08/06/2014   Acute headache 08/06/2014   Balance disorder 08/05/2014   Past Medical History:  Diagnosis Date   Depression    Hyperlipidemia    TBI (traumatic brain injury) ([place])    Von Willebrand disease ([place])    Family History  Problem Relation Age of Onset   COPD Father    Von Willebrand disease Sister    Breast cancer Maternal Grandmother    Past Surgical History:  Procedure Laterality Date   BRAIN SURGERY     TRACHEOSTOMY CLOSURE     TRACHEOSTOMY TUBE PLACEMENT     UTERINE ABLATION     WISDOM TOOTH EXTRACTION     Social History   Occupational History   Occupation: HAIRDRESSER  Tobacco Use   Smoking status: Every Day    Types: Cigarettes   Smokeless tobacco: Never  Substance and Sexual Activity   Alcohol use: Not Currently   Drug use: Not Currently   Sexual activity: Not Currently

## 2022-08-25 NOTE — Progress Notes (Signed)
Pain for over a year; In a boot today.  She has had 2 injections into the heel previously, the second injection she states caused her tendon to rupture. She is here for trial of shockwave therapy

## 2022-09-05 ENCOUNTER — Ambulatory Visit (INDEPENDENT_AMBULATORY_CARE_PROVIDER_SITE_OTHER): Payer: Commercial Managed Care - HMO | Admitting: Sports Medicine

## 2022-09-05 ENCOUNTER — Encounter: Payer: Self-pay | Admitting: Sports Medicine

## 2022-09-05 ENCOUNTER — Ambulatory Visit: Payer: Commercial Managed Care - HMO | Admitting: Sports Medicine

## 2022-09-05 DIAGNOSIS — S86012S Strain of left Achilles tendon, sequela: Secondary | ICD-10-CM

## 2022-09-05 DIAGNOSIS — M6788 Other specified disorders of synovium and tendon, other site: Secondary | ICD-10-CM

## 2022-09-05 NOTE — Progress Notes (Signed)
Stacey Stewart - 57 y.o. female MRN [id number]  Date of birth: 11/21/1965  Office Visit Note: Visit Date: 09/05/2022 PCP: Pcp, No Referred by: No ref. provider found  Subjective: Chief Complaint  Patient presents with   Left Heel - Pain   HPI: Stacey Stewart is a pleasant 57 y.o. female who presents today for follow-up of left achilles tendon tear.  Last visit we did proceed with extracorporeal shockwave treatment therapy.  She had a day or 2 of soreness, but does feel like she has had some improvement.  She continues in her 9/16 inch heel lift.  She has been able to transition more so out of the walking boot, she is not using this at all at home, only when she is standing to work.  At this point, she feels like she is about 40% improved in terms of her pain.  Pertinent ROS were reviewed with the patient and found to be negative unless otherwise specified above in HPI.   Assessment & Plan: Visit Diagnoses:  1. Achilles tendon tear, left, sequela   2. Achilles tendinosis of left lower extremity    Plan: I was reassured that she got some improvement from the first shockwave treatment therapy, we did repeat a treatment session today.  She will continue in the heel lifts bilaterally.  Over the next week I would like her to transition out of the boot fully and more so into supportive tennis shoe wears.  She may use the walking boot only if she is needing to walk for long distances.  We will follow-up next week and consider repeat shockwave treatment therapy.  Eventually we will get her into some home rehab or physical therapy as well to work on strengthening of the tendon.  Additional treatment considerations may be: Physical therapy, nitroglycerin patch protocol to help with healing blood flow  Follow-up: Return in about 1 week (around 09/12/2022) for for left achilles.   Meds & Orders: No orders of the defined types were placed in this encounter.  No orders of the defined types were  placed in this encounter.    Procedures: Procedure: ECSWT Indications:  Achilles tendon partial tear, tendinosis   Procedure Details Consent: Risks of procedure as well as the alternatives and risks of each were explained to the patient.  Verbal consent for procedure obtained. Time Out: Verified patient identification, verified procedure, site was marked, verified correct patient position. The area was cleaned with alcohol swab.     The left achilles tendon was targeted for Extracorporeal shockwave therapy.    Preset: Patellar/Achilles tendinopathy Power Level: 90 mJ Frequency: 10 Hz Impulse/cycles: 2200 Head size: Regular   Patient tolerated procedure well without immediate complications      Clinical History: No specialty comments available.  She reports that she has been smoking cigarettes. She has never used smokeless tobacco. No results for input(s): "HGBA1C", "LABURIC" in the last 8760 hours.  Objective:     Physical Exam  Gen: Well-appearing, in no acute distress; non-toxic CV: Well-perfused. Warm.  Resp: Breathing unlabored on room air; no wheezing. Psych: Fluid speech in conversation; appropriate affect; normal thought process Neuro: Sensation intact throughout. No gross coordination deficits.   Ortho Exam - Left ankle: There is mild soft tissue swelling over the posterior lateral aspect of the ankle.  There is no Achilles tendon step-off.  Mild TTP over the mid substance.  There is no gross restriction in range of motion with dorsiflexion or plantarflexion.  Negative Thompson's  squeeze test.  There is some mild calf atrophy of the left compared to the right leg.  Imaging: No results found.  Past Medical/Family/Surgical/Social History: Medications & Allergies reviewed per EMR, new medications updated. Patient Active Problem List   Diagnosis Date Noted   Benign paroxysmal positional vertigo of right ear 11/12/2019   Dyslipidemia 11/12/2019   Hot flashes due to  menopause 11/12/2019   Short-term memory loss 11/12/2019   Chronic bilateral low back pain with left-sided sciatica 11/15/2016   Cigarette nicotine dependence without complication [id number]   Tobacco abuse 11/15/2016   Anxiety 08/06/2014   History of cerebral hemorrhage 08/06/2014   Migraine without aura and without status migrainosus, not intractable 08/06/2014   Von Willebrand disease ([place]) 08/06/2014   Acute headache 08/06/2014   Balance disorder 08/05/2014   Past Medical History:  Diagnosis Date   Depression    Hyperlipidemia    TBI (traumatic brain injury) ([place])    Von Willebrand disease ([place])    Family History  Problem Relation Age of Onset   COPD Father    Von Willebrand disease Sister    Breast cancer Maternal Grandmother    Past Surgical History:  Procedure Laterality Date   BRAIN SURGERY     TRACHEOSTOMY CLOSURE     TRACHEOSTOMY TUBE PLACEMENT     UTERINE ABLATION     WISDOM TOOTH EXTRACTION     Social History   Occupational History   Occupation: HAIRDRESSER  Tobacco Use   Smoking status: Every Day    Types: Cigarettes   Smokeless tobacco: Never  Substance and Sexual Activity   Alcohol use: Not Currently   Drug use: Not Currently   Sexual activity: Not Currently

## 2022-09-05 NOTE — Progress Notes (Signed)
Cannot tell much of a difference since last visit Still using boot to help ambulate

## 2022-09-15 ENCOUNTER — Ambulatory Visit (INDEPENDENT_AMBULATORY_CARE_PROVIDER_SITE_OTHER): Payer: Commercial Managed Care - HMO | Admitting: Sports Medicine

## 2022-09-15 ENCOUNTER — Encounter: Payer: Self-pay | Admitting: Sports Medicine

## 2022-09-15 ENCOUNTER — Other Ambulatory Visit (INDEPENDENT_AMBULATORY_CARE_PROVIDER_SITE_OTHER): Payer: Commercial Managed Care - HMO

## 2022-09-15 ENCOUNTER — Other Ambulatory Visit: Payer: Self-pay

## 2022-09-15 DIAGNOSIS — M7750 Other enthesopathy of unspecified foot: Secondary | ICD-10-CM

## 2022-09-15 DIAGNOSIS — M79671 Pain in right foot: Secondary | ICD-10-CM | POA: Diagnosis not present

## 2022-09-15 DIAGNOSIS — M79672 Pain in left foot: Secondary | ICD-10-CM

## 2022-09-15 DIAGNOSIS — M25571 Pain in right ankle and joints of right foot: Secondary | ICD-10-CM | POA: Diagnosis not present

## 2022-09-15 DIAGNOSIS — S86012S Strain of left Achilles tendon, sequela: Secondary | ICD-10-CM | POA: Diagnosis not present

## 2022-09-15 NOTE — Progress Notes (Signed)
Stacey Stewart - 57 y.o. female MRN [id number]  Date of birth: March 07, 1966  Office Visit Note: Visit Date: 09/15/2022 PCP: Pcp, No Referred by: No ref. provider found  Subjective: Chief Complaint  Patient presents with   Left Foot - Pain   Right Foot - Pain   HPI: Stacey Stewart is a pleasant 57 y.o. female who presents today for follow-up of left achilles tendon tear. She is also having new onset right posterior heel pain.  As a reminder - Back in early April patient felt a pop in the back of the Achilles when she was walking up the hill.  She underwent MRI -  review of MRI report from [facility] radiology from [place] on 10/20/2021 of the left ankle without contrast demonstrates a partial-thickness intrasubstance tear of the Achilles tendon at the distal insertion.  There is edema within its mid to distal Achilles tendon.  No full-thickness tears.  No retraction.   Today, Joe states that the left Achilles continues to improve.  She is making good improvement from extracorporeal shockwave therapy.  She has completed 2 sessions so far.  She has been able to wean herself out of the walking boot, only had to wear 1 day since last visit when she was working and on her feet all day.  Continues in 9/16" heel lifts in her shoes. At this point, she feels like she is about 80% improved in terms of her pain.  Right heel - has pain at posterior calcaneus.  She states she has had this over the last few months as well, although she recently has been exacerbated.  Having some difficulty with prolonged standing and going up onto the toes. No injury. Feels like she has spurs.  Pertinent ROS were reviewed with the patient and found to be negative unless otherwise specified above in HPI.   Assessment & Plan: Visit Diagnoses:  1. Achilles tendon tear, left, sequela   2. Pain in right ankle and joints of right foot   3. Heel pain, bilateral   4. Enthesopathy of ankle    Plan: Both Stacey Stewart and I are  very pleased with her significant improvement of the left Achilles from the extracorporeal shockwave therapy.  She is getting good relief, we did proceed with an additional treatment today.  We did review her home exercises and gentle stretching as well as strengthening, she will continue these at home.  I would like her to bring in her tennis shoes at next visit so we can work on decreasing the heel lifts into the tennis shoes (9/16 --> lower).  We also evaluate a new problem being her right heel today, x-rays do demonstrate rather notable calcaneal enthesophytes at the distal Achilles insertion.  Through shared decision-making, elected to proceed with a trial of shockwave therapy today for this and she got such good relief from the contralateral heel.  She will continue her oxycodone 10-325 mg every 6 hours as needed as well as her topical Voltaren gel.  She will follow-up next week for reevaluation.  Additional treatment options down the road can be: Meloxicam 15 mg qd, physical therapy, nitroglycerin patch protocol.   Need to be cautious with aspirin-based or anti-inflammatories given her von Willebrand's disease   Follow-up: Return in about 1 week (around 09/22/2022) for for bilateral heels.   Meds & Orders: No orders of the defined types were placed in this encounter.   Orders Placed This Encounter  Procedures   XR Os Calcis  Right     Procedures: Procedure: ECSWT Indications:  Achilles tendon partial tear, tendinosis   Procedure Details Consent: Risks of procedure as well as the alternatives and risks of each were explained to the patient.  Verbal consent for procedure obtained. Time Out: Verified patient identification, verified procedure, site was marked, verified correct patient position. The area was cleaned with alcohol swab.     The left achilles tendon was targeted for Extracorporeal shockwave therapy.    Preset: Achillodynia Power Level: 100 mJ Frequency: 12  Hz Impulse/cycles: 2200 Head size: Regular   Patient tolerated procedure well without immediate complications   The right calcaneus and distal achilles tendon was targeted for Extracorporeal shockwave therapy.    Preset: Calcaneal spur Power Level: 70 mJ Frequency: 10 Hz Impulse/cycles: 1800 Head size: Regular      Clinical History: No specialty comments available.  She reports that she has been smoking cigarettes. She has never used smokeless tobacco. No results for input(s): "HGBA1C", "LABURIC" in the last 8760 hours.  Objective:    Physical Exam  Gen: Well-appearing, in no acute distress; non-toxic CV: Well-perfused. Warm.  Resp: Breathing unlabored on room air; no wheezing. Psych: Fluid speech in conversation; appropriate affect; normal thought process Neuro: Sensation intact throughout. No gross coordination deficits.   Ortho Exam - Bilateral heels: The right ankle there is tenderness to palpation with some mild bony bossing of the right posterior calcaneus.  There is difficulty going up onto the heels of the right, although she is able to do so with bilateral heel raise.  No overlying redness or swelling.  Thompson's squeeze test intact.  Left ankle there is some mild TTP near the insertional aspect of the Achilles on the calcaneus.  Thompson squeeze test is intact, although less strength in plantarflexion compared to the contralateral leg.  Mild calf atrophy of the left calf compared to the right.  Imaging: XR Os Calcis Right  Result Date: 09/15/2022 2 views of the calcaneus including os view and lateral view was ordered and reviewed by myself.  X-rays demonstrate rather significant calcaneal enthesophyte change with spurring within the distal Achilles insertion.  Very small Haglund deformity.   Past Medical/Family/Surgical/Social History: Medications & Allergies reviewed per EMR, new medications updated. Patient Active Problem List   Diagnosis Date Noted   Benign  paroxysmal positional vertigo of right ear 11/12/2019   Dyslipidemia 11/12/2019   Hot flashes due to menopause 11/12/2019   Short-term memory loss 11/12/2019   Chronic bilateral low back pain with left-sided sciatica 11/15/2016   Cigarette nicotine dependence without complication [id number]   Tobacco abuse 11/15/2016   Anxiety 08/06/2014   History of cerebral hemorrhage 08/06/2014   Migraine without aura and without status migrainosus, not intractable 08/06/2014   Von Willebrand disease ([place]) 08/06/2014   Acute headache 08/06/2014   Balance disorder 08/05/2014   Past Medical History:  Diagnosis Date   Depression    Hyperlipidemia    TBI (traumatic brain injury) ([place])    Von Willebrand disease ([place])    Family History  Problem Relation Age of Onset   COPD Father    Von Willebrand disease Sister    Breast cancer Maternal Grandmother    Past Surgical History:  Procedure Laterality Date   BRAIN SURGERY     TRACHEOSTOMY CLOSURE     TRACHEOSTOMY TUBE PLACEMENT     UTERINE ABLATION     WISDOM TOOTH EXTRACTION     Social History   Occupational History   Occupation:  HAIRDRESSER  Tobacco Use   Smoking status: Every Day    Types: Cigarettes   Smokeless tobacco: Never  Substance and Sexual Activity   Alcohol use: Not Currently   Drug use: Not Currently   Sexual activity: Not Currently   I spent 34 minutes in the care of the patient today including face-to-face time, preparation to see the patient, as well as review of home rehab plan, educating on heel lifts and conservative management at home, discussion and treatment of extracorporeal shockwave therapy, review of external notes pertinent to her heel and Achilles pain for the above diagnoses.   Elba Barman, DO Primary Care Sports Medicine Physician  [place]  This note was dictated using Dragon naturally speaking software and may contain errors in syntax, spelling, or content which have not been  identified prior to signing this note.

## 2022-09-15 NOTE — Progress Notes (Signed)
Left is doing better today. Says right heel hurts more today

## 2022-09-27 ENCOUNTER — Encounter: Payer: Self-pay | Admitting: Sports Medicine

## 2022-09-27 ENCOUNTER — Ambulatory Visit (INDEPENDENT_AMBULATORY_CARE_PROVIDER_SITE_OTHER): Payer: Commercial Managed Care - HMO | Admitting: Sports Medicine

## 2022-09-27 DIAGNOSIS — D6800 Von Willebrand disease, unspecified: Secondary | ICD-10-CM

## 2022-09-27 DIAGNOSIS — M7750 Other enthesopathy of unspecified foot: Secondary | ICD-10-CM

## 2022-09-27 DIAGNOSIS — M79671 Pain in right foot: Secondary | ICD-10-CM | POA: Diagnosis not present

## 2022-09-27 DIAGNOSIS — S86012S Strain of left Achilles tendon, sequela: Secondary | ICD-10-CM | POA: Diagnosis not present

## 2022-09-27 DIAGNOSIS — M79672 Pain in left foot: Secondary | ICD-10-CM

## 2022-09-27 DIAGNOSIS — S39012A Strain of muscle, fascia and tendon of lower back, initial encounter: Secondary | ICD-10-CM

## 2022-09-27 NOTE — Progress Notes (Signed)
Stacey Stewart - 57 y.o. female MRN [id number]  Date of birth: 1966/06/17  Office Visit Note: Visit Date: 09/27/2022 PCP: Pcp, No Referred by: No ref. provider found  Subjective: Chief Complaint  Patient presents with   Right Foot - Follow-up   Left Foot - Follow-up   HPI: Stacey Stewart is a pleasant 57 y.o. female who presents today for follow-up of left Achilles tendon tear; right heel pain, also with some back strain from moving.  As a reminder - Back in early April patient felt a pop in the back of the Achilles when she was walking up the hill.  She underwent MRI -  review of MRI report from [facility] radiology from [place] on 10/20/2021 of the left ankle without contrast demonstrates a partial-thickness intrasubstance tear of the Achilles tendon at the distal insertion.  There is edema within its mid to distal Achilles tendon.  No full-thickness tears.  No retraction.   Stacey Stewart continues to make improvement from the left Achilles, we have done 3 sessions of extracorporeal shockwave therapy as well as placing her in a 9/16 inch heel lift.  She continues completely out of the walking boot and still in the heel lifts. Feels like she is significantly improving with her pain.  She continues with her home exercises and stretching once daily.  We did a trial of this ESWT for the posterior calcaneus and enthesophyte on the right heel, does feel like this has been helpful as well.  She continues on oxycodone 10-325 mg every 6 hours as needed; also uses Tylenol or Voltaren gel as needed.  She is currently moving into her new home and has been moving a lot of boxes and doing lifting.  Her back is flared up for this.  Recently started on a prednisone pak by Dr. Cannon Kettle.  States that this reacted negatively with her, giving her nightmares and making her feel weird.  Pertinent ROS were reviewed with the patient and found to be negative unless otherwise specified above in HPI.   Assessment &  Plan: Visit Diagnoses:  1. Heel pain, bilateral   2. Achilles tendon tear, left, sequela   3. Enthesopathy of ankle   4. Von Willebrand disease ([place])   5. Strain of lumbar region, initial encounter    Plan: Discussed with Stacey Stewart that she continues to find improvement for the Achilles from the extracorporeal shockwave therapy, as well as recently for her calcaneal heel spur/enthesophyte.  Through shared decision-making, elected to repeat treatment with this today.  I would like her to continue her home exercises and gentle strengthening/stretching as well, she will do these once daily at home.  I did provide her with 7/16 inch heel lifts, she may transition to these from the 9/16 inch starting next week.  Given the side effects, she will discontinue the methylprednisolone 21-pill taper pack that was previously prescribed.  She will continue her oxycodone 10-325 mg every 6 hours as needed for pain control; Voltaren 1% gel as needed.  She will follow-up next week.  Follow-up: Return in about 1 week (around 10/04/2022) for for b/l heel/achilles.   Meds & Orders:    - discontinue methylprednislone (medrol dosepak) 4mg  TBPK   Procedures: Procedure: ECSWT Indications:  Achilles tendon partial tear, tendinosis   Procedure Details Consent: Risks of procedure as well as the alternatives and risks of each were explained to the patient.  Verbal consent for procedure obtained. Time Out: Verified patient identification, verified procedure, site was  marked, verified correct patient position. The area was cleaned with alcohol swab.     The left achilles tendon was targeted for Extracorporeal shockwave therapy.    Preset: Achillodynia Power Level: 110 mJ Frequency: 12 Hz Impulse/cycles: 2300 Head size: Regular   Patient tolerated procedure well without immediate complications    The right calcaneus and distal achilles tendon was targeted for Extracorporeal shockwave therapy.    Preset: Calcaneal  spur Power Level: 70 mJ Frequency: 10 Hz Impulse/cycles: 1600 Head size: Regular    -Toradol 50 mg/mL was administered into the left buttock today.     Clinical History: No specialty comments available.  She reports that she has been smoking cigarettes. She has never used smokeless tobacco. No results for input(s): "HGBA1C", "LABURIC" in the last 8760 hours.  Objective:    Physical Exam  Gen: Well-appearing, in no acute distress; non-toxic CV: Well-perfused. Warm.  Resp: Breathing unlabored on room air; no wheezing. Psych: Fluid speech in conversation; appropriate affect; normal thought process Neuro: Sensation intact throughout. No gross coordination deficits.   Ortho Exam -Bilateral heel/foot: There is a mild bony bossing of the right superior aspect of the posterior calcaneus, negative heel squeeze.  The left Achilles does have a very small palpable step-off, however negative Thompson's squeeze test, there is intact plantarflexion with the test.  Mild calf atrophy of the left compared to the right.  The left leg there is some generalized trace to 1+ edema and color change of the left leg, patient states this is from prior brain injury.   -Low back: No midline spinous process TTP.  There is some gentle range of motion with flexion and extension.  5/5 strength of bilateral lower extremities.  Imaging:  09/15/22: XR Os Calcis Right 2 views of the calcaneus including os view and lateral view was ordered  and reviewed by myself.  X-rays demonstrate rather significant calcaneal  enthesophyte change with spurring within the distal Achilles insertion.   Very small Haglund deformity.    Past Medical/Family/Surgical/Social History: Medications & Allergies reviewed per EMR, new medications updated. Patient Active Problem List   Diagnosis Date Noted   Benign paroxysmal positional vertigo of right ear 11/12/2019   Dyslipidemia 11/12/2019   Hot flashes due to menopause 11/12/2019    Short-term memory loss 11/12/2019   Chronic bilateral low back pain with left-sided sciatica 11/15/2016   Cigarette nicotine dependence without complication [id number]   Tobacco abuse 11/15/2016   Anxiety 08/06/2014   History of cerebral hemorrhage 08/06/2014   Migraine without aura and without status migrainosus, not intractable 08/06/2014   Von Willebrand disease ([place]) 08/06/2014   Acute headache 08/06/2014   Balance disorder 08/05/2014   Past Medical History:  Diagnosis Date   Depression    Hyperlipidemia    TBI (traumatic brain injury) ([place])    Von Willebrand disease ([place])    Family History  Problem Relation Age of Onset   COPD Father    Von Willebrand disease Sister    Breast cancer Maternal Grandmother    Past Surgical History:  Procedure Laterality Date   BRAIN SURGERY     TRACHEOSTOMY CLOSURE     TRACHEOSTOMY TUBE PLACEMENT     UTERINE ABLATION     WISDOM TOOTH EXTRACTION     Social History   Occupational History   Occupation: HAIRDRESSER  Tobacco Use   Smoking status: Every Day    Types: Cigarettes   Smokeless tobacco: Never  Substance and Sexual Activity   Alcohol use:  Not Currently   Drug use: Not Currently   Sexual activity: Not Currently

## 2022-10-04 ENCOUNTER — Ambulatory Visit: Payer: Commercial Managed Care - HMO | Admitting: Sports Medicine

## 2022-10-11 ENCOUNTER — Encounter: Payer: Self-pay | Admitting: Sports Medicine

## 2022-10-11 ENCOUNTER — Ambulatory Visit (INDEPENDENT_AMBULATORY_CARE_PROVIDER_SITE_OTHER): Payer: Commercial Managed Care - HMO | Admitting: Sports Medicine

## 2022-10-11 DIAGNOSIS — S86012S Strain of left Achilles tendon, sequela: Secondary | ICD-10-CM | POA: Diagnosis not present

## 2022-10-11 DIAGNOSIS — M79672 Pain in left foot: Secondary | ICD-10-CM

## 2022-10-11 DIAGNOSIS — M7750 Other enthesopathy of unspecified foot: Secondary | ICD-10-CM

## 2022-10-11 DIAGNOSIS — R2689 Other abnormalities of gait and mobility: Secondary | ICD-10-CM

## 2022-10-11 DIAGNOSIS — M79671 Pain in right foot: Secondary | ICD-10-CM | POA: Diagnosis not present

## 2022-10-11 MED ORDER — NITROGLYCERIN 0.2 MG/HR TD PT24: MEDICATED_PATCH | TRANSDERMAL | 1 refills | Status: DC

## 2022-10-11 NOTE — Progress Notes (Signed)
Doing better; states she has noticed an Improvement with the shockwave therapy.

## 2022-10-11 NOTE — Progress Notes (Signed)
Stacey Stewart - 57 y.o. female MRN [id number]  Date of birth: 07/16/1965  Office Visit Note: Visit Date: 10/11/2022 PCP: Pcp, No Referred by: No ref. provider found  Subjective: Chief Complaint  Patient presents with   Left Heel - Pain   Right Heel - Pain   HPI: Stacey Stewart is a pleasant 57 y.o. female who presents today for left achilles tear and right heel pain.  Left achilles - has a history of partial tear that was aggravated back in April.  He has been doing extracorporeal shockwave therapy, heel lifts, as well as home rehab exercises which have been improving for her.  Feels like she is at least 50 to 60% better over the past 1-2 months.   Right heel -she does have enthesophytes off the posterior calcaneus.  Last visit we did do a trial of this ESWT as she found-helpful.  She continues on her chronic oxycodone 10-325 mg every 6 hours as needed; also uses Tylenol or Voltaren gel as needed.  She is unable to take NSAIDs given her von Willebrand's disease. She did not tolerate a methylprednisolone taper in the past as this gave her side effects and returns.  Pertinent ROS were reviewed with the patient and found to be negative unless otherwise specified above in HPI.   Assessment & Plan: Visit Diagnoses:  1. Achilles tendon tear, left, sequela   2. Heel pain, bilateral   3. Enthesopathy of ankle   4. Functional gait abnormality    Plan: Both Stacey Stewart and I are pleased with the improvement she is making with extracorporeal shockwave therapy, home exercises, as well as her tapering down from the heel lifts.  Future decision making, did repeat ESWT treatment today.  She will continue her home exercises and stretching for both bilateral Achilles and heels once daily at home.  We did remove the 7/16 inch heel lifts today in transition of the anterior and arch support orthotic, she found these to be comfortable.  She will continue her oxycodone 10-325 mg every 6 hours as needed for pain  control.  We discussed other treatment options such as nitroglycerin patch protocol to help with her recovery of the Achilles tendon tear and a calcaneal enthesophyte, she is agreeable to try this.  She will place Nitroglycerin 0.2 mg/h patch over the affected area and change every 24 hours.  I would only like her to have 1 patch at a time and alternate heels/Achilles given her history of mild migraines that have been well-controlled.  She will follow-up in 2 weeks for reevaluation.  Follow-up: Return in about 2 weeks (around 10/25/2022) for for b/l heels.   Meds & Orders:  Meds ordered this encounter  Medications   nitroGLYCERIN (NITRODUR - DOSED IN MG/24 HR) 0.2 mg/hr patch    Sig: Cut patch into fourths. Applied 1/4 patch to affected area, change every 24 hours.    Dispense:  30 patch    Refill:  1   No orders of the defined types were placed in this encounter.    Procedures: Procedure: ECSWT Indications:  Achilles tendon partial tear, tendinosis   Procedure Details Consent: Risks of procedure as well as the alternatives and risks of each were explained to the patient.  Verbal consent for procedure obtained. Time Out: Verified patient identification, verified procedure, site was marked, verified correct patient position. The area was cleaned with alcohol swab.     The left achilles tendon was targeted for Extracorporeal shockwave therapy.  Preset: Achillodynia Power Level: 110 mJ Frequency: 12 Hz Impulse/cycles: 2200 Head size: Regular   Patient tolerated procedure well without immediate complications    The right calcaneus and distal achilles tendon was targeted for Extracorporeal shockwave therapy.    Preset: Calcaneal spur Power Level: 80 mJ Frequency: 10 Hz Impulse/cycles: 1600 Head size: Regular        Clinical History: No specialty comments available.  She reports that she has been smoking cigarettes. She has never used smokeless tobacco. No results for input(s):  "HGBA1C", "LABURIC" in the last 8760 hours.  Objective:    Physical Exam  Gen: Well-appearing, in no acute distress; non-toxic CV:  Well-perfused. Warm.  Resp: Breathing unlabored on room air; no wheezing. Psych: Fluid speech in conversation; appropriate affect; normal thought process Neuro: Sensation intact throughout. No gross coordination deficits.   Ortho Exam - Bilateral heel/feet: There is a mild bony bossing of the right superior aspect of the posterior calcaneus, negative heel squeeze. + TTP at the posterior insertional achilles. The left Achilles does have a very small palpable step-off, however negative Thompson's squeeze test, there is intact plantarflexion with the test. Mild calf atrophy of the left compared to the right. The left leg there is some generalized trace to 1+ edema (patient states this is chronic from prior brain injury).  Imaging: XR Os Calcis Right 2 views of the calcaneus including os view and lateral view was ordered  and reviewed by myself.  X-rays demonstrate rather significant calcaneal  enthesophyte change with spurring within the distal Achilles insertion.   Very small Haglund deformity.   Past Medical/Family/Surgical/Social History: Medications & Allergies reviewed per EMR, new medications updated. Patient Active Problem List   Diagnosis Date Noted   Benign paroxysmal positional vertigo of right ear 11/12/2019   Dyslipidemia 11/12/2019   Hot flashes due to menopause 11/12/2019   Short-term memory loss 11/12/2019   Chronic bilateral low back pain with left-sided sciatica 11/15/2016   Cigarette nicotine dependence without complication 11/15/2016   Tobacco abuse 11/15/2016   Anxiety 08/06/2014   History of cerebral hemorrhage 08/06/2014   Migraine without aura and without status migrainosus, not intractable 08/06/2014   Von Willebrand disease 08/06/2014   Acute headache 08/06/2014   Balance disorder 08/05/2014   Past Medical History:   Diagnosis Date   Depression    Hyperlipidemia    TBI (traumatic brain injury) ([facility])    Von Willebrand disease ([facility])    Family History  Problem Relation Age of Onset   COPD Father    Von Willebrand disease Sister    Breast cancer Maternal Grandmother    Past Surgical History:  Procedure Laterality Date   BRAIN SURGERY     TRACHEOSTOMY CLOSURE     TRACHEOSTOMY TUBE PLACEMENT     UTERINE ABLATION     WISDOM TOOTH EXTRACTION     Social History   Occupational History   Occupation: HAIRDRESSER  Tobacco Use   Smoking status: Every Day    Types: Cigarettes   Smokeless tobacco: Never  Substance and Sexual Activity   Alcohol use: Not Currently   Drug use: Not Currently   Sexual activity: Not Currently

## 2022-10-11 NOTE — Patient Instructions (Signed)
Nitroglycerin Protocol  Apply 1/4 nitroglycerin patch to affected area daily --> Stacey Stewart for you, you will alternate the patch on each heel every other day Change position of patch within the affected area every 24 hours. You may experience a headache during the first 1-2 weeks of using the patch, these should subside. If you experience headaches after beginning nitroglycerin patch treatment, you may take your preferred over the counter pain reliever. Another side effect of the nitroglycerin patch is skin irritation or rash related to patch adhesive. Please notify our office if you develop more severe headaches or rash, and stop the patch. Tendon healing with nitroglycerin patch may require 12 to 24 weeks depending on the extent of injury. Men should not use if taking Viagra, Cialis, or Levitra.  If you have migraines or rosacea, these can make this worse

## 2022-10-25 ENCOUNTER — Ambulatory Visit: Payer: Commercial Managed Care - HMO | Admitting: Sports Medicine

## 2022-10-25 ENCOUNTER — Encounter: Payer: Self-pay | Admitting: Sports Medicine

## 2022-10-25 DIAGNOSIS — M79671 Pain in right foot: Secondary | ICD-10-CM | POA: Diagnosis not present

## 2022-10-25 DIAGNOSIS — M25472 Effusion, left ankle: Secondary | ICD-10-CM

## 2022-10-25 DIAGNOSIS — S86012S Strain of left Achilles tendon, sequela: Secondary | ICD-10-CM

## 2022-10-25 DIAGNOSIS — M7750 Other enthesopathy of unspecified foot: Secondary | ICD-10-CM | POA: Diagnosis not present

## 2022-10-25 DIAGNOSIS — D6800 Von Willebrand disease, unspecified: Secondary | ICD-10-CM | POA: Diagnosis not present

## 2022-10-25 DIAGNOSIS — M79672 Pain in left foot: Secondary | ICD-10-CM

## 2022-10-25 DIAGNOSIS — M25471 Effusion, right ankle: Secondary | ICD-10-CM

## 2022-10-25 MED ORDER — PREDNISONE 10 MG PO TABS: 10.0000 mg | ORAL_TABLET | Freq: Two times a day (BID) | ORAL | 0 refills | Status: AC

## 2022-10-25 NOTE — Progress Notes (Signed)
Stacey Stewart - 57 y.o. female MRN [id number]  Date of birth: 1965-10-16  Office Visit Note: Visit Date: 10/25/2022 PCP: Pcp, No Referred by: No ref. provider found  Subjective: Chief Complaint  Patient presents with   Left Heel - Follow-up   Right Heel - Follow-up   HPI: Stacey Stewart is a pleasant 57 y.o. female who presents today for bilateral heel pain.  Left Achilles -history of partial tear that was aggravated back in April.  Certainly finding benefit from extracorporeal shockwave therapy and home rehab exercises.  Feels like the left ankle in general has been more swollen.  Right heel -has known bone spur and enthesophyte off the posterior calcaneus.  2 trials of ESWT which she is finding helpful.  Both heels and ankles are slightly irritated as she has been doing more standing.  Continuing in the orthotics.  She does take chronic oxycodone 10-325 mg every 6 hours as needed.  Pertinent ROS were reviewed with the patient and found to be negative unless otherwise specified above in HPI.   Assessment & Plan: Visit Diagnoses:  1. Achilles tendon tear, left, sequela   2. Heel pain, bilateral   3. Enthesopathy of ankle   4. Von Willebrand disease   5. Swelling of both ankles    Plan: Tyese unfortunately has some increased swelling of the ankles and feet and has had a mild exacerbation of her symptoms given her prolonged standing.  She cannot take NSAIDs given her von Willebrand's disease, through shared decision making elected to proceed with a low-dose of prednisone, she will take prednisone 10 mg twice daily for the next 7 days.  She is continuing her nitroglycerin patch alternating between the left and the right heel every day.  She continue her chronic oxycodone for pain control as needed.  I would like her to rest from her home exercises and stretching for the remainder of this week to get this to settle down.  She may then resume starting next Monday.  She will continue in  her orthotics, I did place a 5/16 inch heel lift into the left shoe to help balance her leg length discrepancy.  Follow-up in 2 weeks.  Follow-up: Return in about 2 weeks (around 11/08/2022).   Meds & Orders:  Meds ordered this encounter  Medications   predniSONE (DELTASONE) 10 MG tablet    Sig: Take 1 tablet (10 mg total) by mouth 2 (two) times daily with a meal for 7 days.    Dispense:  14 tablet    Refill:  0   No orders of the defined types were placed in this encounter.    Procedures: Procedure: ECSWT Indications:  Achilles tendon partial tear, tendinosis   Procedure Details Consent: Risks of procedure as well as the alternatives and risks of each were explained to the patient.  Verbal consent for procedure obtained. Time Out: Verified patient identification, verified procedure, site was marked, verified correct patient position. The area was cleaned with alcohol swab.     The left achilles tendon was targeted for Extracorporeal shockwave therapy.    Preset: Achillodynia Power Level: 110 mJ Frequency: 12 Hz Impulse/cycles: 2000 Head size: Regular   Patient tolerated procedure well without immediate complications    The right calcaneus and distal achilles tendon was targeted for Extracorporeal shockwave therapy.    Preset: Calcaneal spur Power Level: 70-80 mJ Frequency: 12 Hz Impulse/cycles: 1500 Head size: Regular         Clinical History: No  specialty comments available.  She reports that she has been smoking cigarettes. She has never used smokeless tobacco. No results for input(s): "HGBA1C", "LABURIC" in the last 8760 hours.  Objective:   Vital Signs: There were no vitals taken for this visit.  Physical Exam  Gen: Well-appearing, in no acute distress; non-toxic CV: Well-perfused. Warm.  Resp: Breathing unlabored on room air; no wheezing. Psych: Fluid speech in conversation; appropriate affect; normal thought process Neuro: Sensation intact throughout. No  gross coordination deficits.   Ortho Exam - Bilateral feet/ankles: Small spur off the posterior aspect of the right calcaneus near the Achilles insertion.  Negative heel squeeze.  There is a gentle step-off of the left Achilles however negative Thompson squeeze test, intact plantarflexion with this test.  There is some generalized 1+ edema to bilateral ankles and feet.  No redness or swelling.  Antalgic gait today.  Imaging:  09/15/22:  XR Os Calcis Right 2 views of the calcaneus including os view and lateral view was ordered  and reviewed by myself.  X-rays demonstrate rather significant calcaneal  enthesophyte change with spurring within the distal Achilles insertion.   Very small Haglund deformity.     Past Medical/Family/Surgical/Social History: Medications & Allergies reviewed per EMR, new medications updated. Patient Active Problem List   Diagnosis Date Noted   Benign paroxysmal positional vertigo of right ear 11/12/2019   Dyslipidemia 11/12/2019   Hot flashes due to menopause 11/12/2019   Short-term memory loss 11/12/2019   Chronic bilateral low back pain with left-sided sciatica 11/15/2016   Cigarette nicotine dependence without complication 11/15/2016   Tobacco abuse 11/15/2016   Anxiety 08/06/2014   History of cerebral hemorrhage 08/06/2014   Migraine without aura and without status migrainosus, not intractable 08/06/2014   Von Willebrand disease 08/06/2014   Acute headache 08/06/2014   Balance disorder 08/05/2014   Past Medical History:  Diagnosis Date   Depression    Hyperlipidemia    TBI (traumatic brain injury)    Von Willebrand disease    Family History  Problem Relation Age of Onset   COPD Father    Von Willebrand disease Sister    Breast cancer Maternal Grandmother    Past Surgical History:  Procedure Laterality Date   BRAIN SURGERY     TRACHEOSTOMY CLOSURE     TRACHEOSTOMY TUBE PLACEMENT     UTERINE ABLATION     WISDOM TOOTH EXTRACTION      Social History   Occupational History   Occupation: HAIRDRESSER  Tobacco Use   Smoking status: Every Day    Types: Cigarettes   Smokeless tobacco: Never  Substance and Sexual Activity   Alcohol use: Not Currently   Drug use: Not Currently   Sexual activity: Not Currently

## 2022-10-25 NOTE — Progress Notes (Signed)
In pain today; walking with a limp States her ankle is really swollen

## 2022-11-08 ENCOUNTER — Ambulatory Visit: Payer: Commercial Managed Care - HMO | Admitting: Sports Medicine

## 2022-11-08 ENCOUNTER — Encounter: Payer: Self-pay | Admitting: Sports Medicine

## 2022-11-08 DIAGNOSIS — S86012S Strain of left Achilles tendon, sequela: Secondary | ICD-10-CM | POA: Diagnosis not present

## 2022-11-08 DIAGNOSIS — M79671 Pain in right foot: Secondary | ICD-10-CM

## 2022-11-08 DIAGNOSIS — M7750 Other enthesopathy of unspecified foot: Secondary | ICD-10-CM

## 2022-11-08 DIAGNOSIS — M79672 Pain in left foot: Secondary | ICD-10-CM | POA: Diagnosis not present

## 2022-11-08 NOTE — Progress Notes (Signed)
States she is doing better; feels like the shockwave is helping

## 2022-11-08 NOTE — Progress Notes (Signed)
Stacey Stewart - 57 y.o. female MRN [id number]  Date of birth: August 12, 1965  Office Visit Note: Visit Date: 11/08/2022 PCP: Pcp, No Referred by: No ref. provider found  Subjective: Chief Complaint  Patient presents with   Left Heel - Follow-up   Right Heel - Follow-up   HPI: Stacey Stewart is a pleasant 57 y.o. female who presents today for follow-up of bilateral heel pain.  Left Achilles -history of partial tear that was aggravated back in April.  Certainly finding benefit from extracorporeal shockwave therapy and home rehab exercises.  Feels like at this point she is at least 80% improved.  Right heel -has small Haglund deformity and enthesophytes of the posterior calcaneus.  Has been finding some benefit from ESWT.  Feels like she is about 50% improved since initial treatments.  She continues in her orthotics.  She is alternately needing the 0.2 mg/h nitroglycerin patch from bilateral heels. Continues on her chronic oxycodone 10-325 mg every 6 hours as needed.  Pertinent ROS were reviewed with the patient and found to be negative unless otherwise specified above in HPI.   Assessment & Plan: Visit Diagnoses:  1. Achilles tendon tear, left, sequela   2. Heel pain, bilateral   3. Enthesopathy of ankle    Plan: She is making great improvement from her prior achilles tear, from this point I think she would benefit from only about 1 or 2 more treatments.  She will continue her Achilles strengthening and stretching exercise program at home.  In terms of the right heel, it seems that this is more bothersome over the bony origin near her Haglund deformity and possibly the calcaneal enthesophytes.  I would like to see if she continues to have more than 50% relief from 1 or 2 additional treatments, if she does we will continue.  If she does not get great improvement, we may have her see Dr. Lajoyce Corners to discuss possible surgical or other treatment modalities.  She will continue her nitroglycerin patch  0.2mg /hr protocol, alternating heels every day. Continue her chronic oxycodone 10-325 mg every 6 hours as needed.  Follow-up: Return in about 1 week (around 11/15/2022) for b/l heels.   Meds & Orders: No orders of the defined types were placed in this encounter.  No orders of the defined types were placed in this encounter.   - 30mg /mL of Toradol administered into the right gluteal muscle today  Procedures: Procedure: ECSWT Indications:  Achilles tendon partial tear, tendinosis   Procedure Details Consent: Risks of procedure as well as the alternatives and risks of each were explained to the patient.  Verbal consent for procedure obtained. Time Out: Verified patient identification, verified procedure, site was marked, verified correct patient position. The area was cleaned with alcohol swab.     The left achilles tendon was targeted for Extracorporeal shockwave therapy.    Preset: Achillodynia Power Level: 120 mJ Frequency: 12 Hz Impulse/cycles: 2000 Head size: Regular   Patient tolerated procedure well without immediate complications    The right calcaneus and distal achilles tendon was targeted for Extracorporeal shockwave therapy.    Preset: Calcaneal spur Power Level: 80 mJ Frequency: 12 Hz Impulse/cycles: 1800 Head size: Regular        Clinical History: No specialty comments available.  She reports that she has been smoking cigarettes. She has never used smokeless tobacco. No results for input(s): "HGBA1C", "LABURIC" in the last 8760 hours.  Objective:   Vital Signs: There were no vitals taken for  this visit.  Physical Exam  Gen: Well-appearing, in no acute distress; non-toxic CV: Regular Rate. Well-perfused. Warm.  Resp: Breathing unlabored on room air; no wheezing. Psych: Fluid speech in conversation; appropriate affect; normal thought process Neuro: Sensation intact throughout. No gross coordination deficits.   Ortho Exam - Bilateral heels: There is a small  bony spur at the posterior aspect of the right calcaneus.  Very mild swelling around this region.  Negative heel squeeze bilaterally.  Small step-off of the left Achilles with intact Thompson squeeze test.  Imaging: No results found.  Past Medical/Family/Surgical/Social History: Medications & Allergies reviewed per EMR, new medications updated. Patient Active Problem List   Diagnosis Date Noted   Benign paroxysmal positional vertigo of right ear 11/12/2019   Dyslipidemia 11/12/2019   Hot flashes due to menopause 11/12/2019   Short-term memory loss 11/12/2019   Chronic bilateral low back pain with left-sided sciatica 11/15/2016   Cigarette nicotine dependence without complication 11/15/2016   Tobacco abuse 11/15/2016   Anxiety 08/06/2014   History of cerebral hemorrhage 08/06/2014   Migraine without aura and without status migrainosus, not intractable 08/06/2014   Von Willebrand disease (HCC) 08/06/2014   Acute headache 08/06/2014   Balance disorder 08/05/2014   Past Medical History:  Diagnosis Date   Depression    Hyperlipidemia    TBI (traumatic brain injury) (HCC)    Von Willebrand disease (HCC)    Family History  Problem Relation Age of Onset   COPD Father    Von Willebrand disease Sister    Breast cancer Maternal Grandmother    Past Surgical History:  Procedure Laterality Date   BRAIN SURGERY     TRACHEOSTOMY CLOSURE     TRACHEOSTOMY TUBE PLACEMENT     UTERINE ABLATION     WISDOM TOOTH EXTRACTION     Social History   Occupational History   Occupation: HAIRDRESSER  Tobacco Use   Smoking status: Every Day    Types: Cigarettes   Smokeless tobacco: Never  Substance and Sexual Activity   Alcohol use: Not Currently   Drug use: Not Currently   Sexual activity: Not Currently

## 2022-11-22 ENCOUNTER — Encounter: Payer: Self-pay | Admitting: Sports Medicine

## 2022-11-22 ENCOUNTER — Ambulatory Visit: Payer: Commercial Managed Care - HMO | Admitting: Sports Medicine

## 2022-11-22 DIAGNOSIS — I10 Essential (primary) hypertension: Secondary | ICD-10-CM

## 2022-11-22 DIAGNOSIS — M79671 Pain in right foot: Secondary | ICD-10-CM | POA: Diagnosis not present

## 2022-11-22 DIAGNOSIS — M7750 Other enthesopathy of unspecified foot: Secondary | ICD-10-CM

## 2022-11-22 DIAGNOSIS — S86012S Strain of left Achilles tendon, sequela: Secondary | ICD-10-CM | POA: Diagnosis not present

## 2022-11-22 DIAGNOSIS — M79672 Pain in left foot: Secondary | ICD-10-CM

## 2022-11-22 NOTE — Progress Notes (Signed)
Stacey Stewart - 57 y.o. female MRN [id number]  Date of birth: [birth year]  Office Visit Note: Visit Date: 11/22/2022 Stacey Stewart: Stacey Stewart, No Referred by: No ref. provider found  Subjective: Chief Complaint  Patient presents with   Left Heel - Follow-up   Right Heel - Follow-up   HPI: Stacey Stewart is a pleasant 57 y.o. female who presents today for follow-up of bilateral heel pain.  Left achilles - has a history of prior partial tear of the that was aggravated back in April.  Has been finding great benefit from extracorporeal shockwave therapy, also continues with her home rehab exercise series.  Feels like this is largely improved, almost back to normal.  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  Right heel -has a small Haglund deformity and send enthesophyte off the posterior calcaneus.  Benefiting from extracorporeal shockwave therapy.  Feels like she is 80% improved from the start of treatment.  She does continue to alternate the nitroglycerin 0.2 megs per hour patch from this heel and into the contralateral left Achilles to aid in augmentation of healing.  Prior to our last visit she unfortunately was hospitalized for hypertensive urgency.  She is working with her Stacey Stewart on blood pressure medication management, per jail her blood pressure is much better controlled.  She continues on chronic oxycodone 10-325 mg every 6 hours as needed.  *Nitro patch --> started 10/11/22  Pertinent ROS were reviewed with the patient and found to be negative unless otherwise specified above in HPI.   Assessment & Plan: Visit Diagnoses:  1. Achilles tendon tear, left, sequela   2. Heel pain, bilateral   3. Enthesopathy of ankle   4. Primary hypertension    Plan: Both Stacey Stewart and I are pleased with the improvement she has made both from the prior Achilles partial tear as well as her enthesophyte and Haglund deformity from extracorporeal shockwave therapy and her home exercise program.   Through shared decision-making did repeat ESWT today to both areas.  She will continue in her orthotics and good supportive shoe wear during her job.  She will continue her home exercises daily.  I would like her to continue her nitroglycerin patch 0.2 mg/h, alternating between the left Achilles and right posterior calcaneus to help augment with blood flow and feeling.  We did start this on 10/11/2022.  Will plan for between 10-12 weeks of therapy.  She will continue her chronic oxycodone 10-325 mg every 6 hours as needed for generalized pain.  I do think we are close to about 100% improvement, we will have her follow-up next week and plan for either 1-2 more treatments before taking holiday.  Given her hypertension as well as her von Willebrand's disease, discussed importance of no NSAID therapy.  She will continue her blood pressure medication per her Stacey Stewart.  Follow-up: Return in about 2 weeks (around 12/06/2022) for for bilateral heel pain (reg visit).   Meds & Orders: No orders of the defined types were placed in this encounter.  No orders of the defined types were placed in this encounter.    Procedures: Procedure: ECSWT Indications:  Achilles tendon partial tear, tendinosis   Procedure Details Consent: Risks of procedure as well as the alternatives and risks of each were explained to the patient.  Verbal consent for procedure obtained. Time Out: Verified patient identification, verified procedure, site was marked, verified correct patient position. The area was cleaned with alcohol swab.  The left achilles tendon was targeted for Extracorporeal shockwave therapy.    Preset: Achillodynia Power Level: 120 mJ Frequency: 12 Hz Impulse/cycles: 2200 Head size: Regular   Patient tolerated procedure well without immediate complications    The right calcaneus and distal achilles tendon was targeted for Extracorporeal shockwave therapy.    Preset: Calcaneal spur Power Level: 70-80  mJ Frequency: 12 Hz Impulse/cycles: 1800 Head size: Regular         Clinical History: No specialty comments available.  She reports that she has been smoking cigarettes. She has never used smokeless tobacco. No results for input(s): "HGBA1C", "LABURIC" in the last 8760 hours.  Objective:   Vital Signs: There were no vitals taken for this visit.  Physical Exam  Gen: Well-appearing, in no acute distress; non-toxic CV: Well-perfused. Warm.  Resp: Breathing unlabored on room air; no wheezing. Psych: Fluid speech in conversation; appropriate affect; normal thought process Neuro: Sensation intact throughout. No gross coordination deficits.   Ortho Exam -  Bilateral heels: There is a small bony spur at the posterior aspect of the right calcaneus.  Very mild swelling around this region.  Negative heel squeeze bilaterally.  Small step-off of the left Achilles with intact Thompson squeeze test.   Imaging: No results found.  Past Medical/Family/Surgical/Social History: Medications & Allergies reviewed per EMR, new medications updated. Patient Active Problem List   Diagnosis Date Noted   Benign paroxysmal positional vertigo of right ear 11/12/2019   Dyslipidemia 11/12/2019   Hot flashes due to menopause 11/12/2019   Short-term memory loss 11/12/2019   Chronic bilateral low back pain with left-sided sciatica 11/15/2016   Cigarette nicotine dependence without complication 11/15/2016   Tobacco abuse 11/15/2016   Anxiety 08/06/2014   History of cerebral hemorrhage 08/06/2014   Migraine without aura and without status migrainosus, not intractable 08/06/2014   Von Willebrand disease (HCC) 08/06/2014   Acute headache 08/06/2014   Balance disorder 08/05/2014   Past Medical History:  Diagnosis Date   Depression    Hyperlipidemia    TBI (traumatic brain injury) (HCC)    Von Willebrand disease (HCC)    Family History  Problem Relation Age of Onset   COPD Father    Von Willebrand  disease Sister    Breast cancer Maternal Grandmother    Past Surgical History:  Procedure Laterality Date   BRAIN SURGERY     TRACHEOSTOMY CLOSURE     TRACHEOSTOMY TUBE PLACEMENT     UTERINE ABLATION     WISDOM TOOTH EXTRACTION     Social History   Occupational History   Occupation: HAIRDRESSER  Tobacco Use   Smoking status: Every Day    Types: Cigarettes   Smokeless tobacco: Never  Substance and Sexual Activity   Alcohol use: Not Currently   Drug use: Not Currently   Sexual activity: Not Currently

## 2022-11-22 NOTE — Progress Notes (Signed)
Doing ok; states shockwave is helping Right side hurts a little more, but getting better

## 2022-11-29 ENCOUNTER — Other Ambulatory Visit: Payer: Self-pay

## 2022-11-29 DIAGNOSIS — I10 Essential (primary) hypertension: Secondary | ICD-10-CM | POA: Insufficient documentation

## 2022-11-29 DIAGNOSIS — T794XXA Traumatic shock, initial encounter: Secondary | ICD-10-CM | POA: Insufficient documentation

## 2022-11-29 DIAGNOSIS — Z6829 Body mass index (BMI) 29.0-29.9, adult: Secondary | ICD-10-CM | POA: Insufficient documentation

## 2022-11-29 DIAGNOSIS — S069XAA Unspecified intracranial injury with loss of consciousness status unknown, initial encounter: Secondary | ICD-10-CM | POA: Insufficient documentation

## 2022-11-29 DIAGNOSIS — R0789 Other chest pain: Secondary | ICD-10-CM | POA: Insufficient documentation

## 2022-11-29 DIAGNOSIS — F32A Depression, unspecified: Secondary | ICD-10-CM | POA: Insufficient documentation

## 2022-11-29 DIAGNOSIS — D692 Other nonthrombocytopenic purpura: Secondary | ICD-10-CM | POA: Insufficient documentation

## 2022-11-29 DIAGNOSIS — E785 Hyperlipidemia, unspecified: Secondary | ICD-10-CM | POA: Insufficient documentation

## 2022-12-06 ENCOUNTER — Ambulatory Visit: Payer: Commercial Managed Care - HMO | Admitting: Sports Medicine

## 2022-12-19 ENCOUNTER — Ambulatory Visit: Payer: Commercial Managed Care - HMO | Admitting: Sports Medicine

## 2022-12-19 ENCOUNTER — Encounter: Payer: Self-pay | Admitting: Sports Medicine

## 2022-12-19 DIAGNOSIS — M7750 Other enthesopathy of unspecified foot: Secondary | ICD-10-CM | POA: Diagnosis not present

## 2022-12-19 DIAGNOSIS — M79672 Pain in left foot: Secondary | ICD-10-CM

## 2022-12-19 DIAGNOSIS — M79671 Pain in right foot: Secondary | ICD-10-CM | POA: Diagnosis not present

## 2022-12-19 DIAGNOSIS — M25552 Pain in left hip: Secondary | ICD-10-CM | POA: Diagnosis not present

## 2022-12-19 DIAGNOSIS — S86012S Strain of left Achilles tendon, sequela: Secondary | ICD-10-CM | POA: Diagnosis not present
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
## 2022-12-19 NOTE — Progress Notes (Signed)
Doing ok; states she is still having some pain, but has improved  More so complaining of hip pain today

## 2023-01-01 ENCOUNTER — Ambulatory Visit: Payer: Commercial Managed Care - HMO | Admitting: Cardiology

## 2023-01-08 ENCOUNTER — Ambulatory Visit: Payer: Commercial Managed Care - HMO | Admitting: Sports Medicine

## 2023-01-15 ENCOUNTER — Ambulatory Visit: Payer: Commercial Managed Care - HMO | Admitting: Sports Medicine

## 2023-02-14 ENCOUNTER — Ambulatory Visit: Payer: Commercial Managed Care - HMO | Admitting: Cardiology

## 2023-02-15 ENCOUNTER — Other Ambulatory Visit: Payer: Self-pay | Admitting: Sports Medicine

## 2023-02-20 ENCOUNTER — Other Ambulatory Visit: Payer: Self-pay | Admitting: Radiology

## 2023-03-06 ENCOUNTER — Ambulatory Visit: Payer: Commercial Managed Care - HMO | Admitting: Sports Medicine

## 2023-03-19 ENCOUNTER — Other Ambulatory Visit: Payer: Self-pay | Admitting: Sports Medicine

## 2023-03-29 ENCOUNTER — Ambulatory Visit: Payer: Medicaid Other | Admitting: Sports Medicine

## 2023-03-29 DIAGNOSIS — S86012S Strain of left Achilles tendon, sequela: Secondary | ICD-10-CM

## 2023-03-29 DIAGNOSIS — M545 Low back pain, unspecified: Secondary | ICD-10-CM | POA: Diagnosis not present

## 2023-03-29 DIAGNOSIS — M79672 Pain in left foot: Secondary | ICD-10-CM

## 2023-03-29 DIAGNOSIS — G8929 Other chronic pain: Secondary | ICD-10-CM

## 2023-03-29 DIAGNOSIS — M7750 Other enthesopathy of unspecified foot: Secondary | ICD-10-CM | POA: Diagnosis not present

## 2023-03-29 DIAGNOSIS — M79671 Pain in right foot: Secondary | ICD-10-CM | POA: Diagnosis not present

## 2023-03-29 NOTE — Progress Notes (Signed)
Patient states that her achilles is feeling great. She says that today she has no pain. At times, she does feel that it is tight but she is able to stretch it out. Patient also mentions some bruising that is tender at time. Patient is taking Oxycodone for her back pain.  Patient was instructed in 10 minutes of therapeutic exercises for left achilles to improve strength, ROM and function according to my instructions and plan of care by a Certified Athletic Trainer during the office visit. A customized handout was provided and demonstration of proper technique shown and discussed. Patient did perform exercises and demonstrate understanding through teachback.  All questions discussed and answered.

## 2023-03-30 ENCOUNTER — Encounter: Payer: Self-pay | Admitting: Sports Medicine

## 2023-05-04 ENCOUNTER — Other Ambulatory Visit (INDEPENDENT_AMBULATORY_CARE_PROVIDER_SITE_OTHER): Payer: Medicaid Other

## 2023-05-04 ENCOUNTER — Ambulatory Visit (INDEPENDENT_AMBULATORY_CARE_PROVIDER_SITE_OTHER): Payer: Medicaid Other | Admitting: Sports Medicine

## 2023-05-04 DIAGNOSIS — M25572 Pain in left ankle and joints of left foot: Secondary | ICD-10-CM

## 2023-05-04 DIAGNOSIS — S86012S Strain of left Achilles tendon, sequela: Secondary | ICD-10-CM | POA: Diagnosis not present

## 2023-05-04 DIAGNOSIS — M25571 Pain in right ankle and joints of right foot: Secondary | ICD-10-CM
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
## 2023-05-04 NOTE — Progress Notes (Unsigned)
Patient says that in the last two weeks she has had significant bruising and swelling in the left ankle. She does not remember a time where she twisted it or otherwise had an injury. She is having difficulty walking and range of motion is limited due to pain. Observable bruising and swelling is on the medial and posterior aspect of the ankle. She says that she has been taking the medicine she has had, although she is almost out as she has a new doctor for her back and she has not gotten her prescription refilled.

## 2023-05-05 ENCOUNTER — Encounter: Payer: Self-pay | Admitting: Sports Medicine

## 2023-05-11 ENCOUNTER — Ambulatory Visit (INDEPENDENT_AMBULATORY_CARE_PROVIDER_SITE_OTHER): Payer: Medicaid Other | Admitting: Sports Medicine

## 2023-05-11 ENCOUNTER — Other Ambulatory Visit: Payer: Self-pay

## 2023-05-11 DIAGNOSIS — M25572 Pain in left ankle and joints of left foot: Secondary | ICD-10-CM | POA: Diagnosis not present

## 2023-05-11 DIAGNOSIS — S86012D Strain of left Achilles tendon, subsequent encounter: Secondary | ICD-10-CM
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
## 2023-05-11 NOTE — Progress Notes (Unsigned)
Patient says that the bruising has gone down some in the ankle but the swelling is still there. She says she is unable to walk at all without the boot and that she is hurting. She has been taking medication although has not gotten any relief.

## 2023-05-12 ENCOUNTER — Encounter: Payer: Self-pay | Admitting: Sports Medicine

## 2023-05-17 ENCOUNTER — Ambulatory Visit: Payer: Medicaid Other | Admitting: Orthopedic Surgery

## 2023-05-28 ENCOUNTER — Telehealth: Payer: Self-pay | Admitting: Orthopedic Surgery

## 2023-05-28 ENCOUNTER — Ambulatory Visit (INDEPENDENT_AMBULATORY_CARE_PROVIDER_SITE_OTHER): Payer: Medicaid Other | Admitting: Orthopedic Surgery

## 2023-05-28 ENCOUNTER — Other Ambulatory Visit: Payer: Self-pay | Admitting: Oncology

## 2023-05-28 ENCOUNTER — Encounter: Payer: Self-pay | Admitting: Orthopedic Surgery

## 2023-05-28 ENCOUNTER — Telehealth: Payer: Self-pay

## 2023-05-28 DIAGNOSIS — M25572 Pain in left ankle and joints of left foot: Secondary | ICD-10-CM

## 2023-05-28 DIAGNOSIS — S86012D Strain of left Achilles tendon, subsequent encounter: Secondary | ICD-10-CM

## 2023-05-28 MED ORDER — DESMOPRESSIN ACETATE SPRAY 0.01 % NA SOLN
1.0000 | NASAL | 1 refills | Status: AC | PRN
Start: 2023-05-28 — End: ?
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
## 2023-05-28 NOTE — Telephone Encounter (Signed)
Patient called and said that she has the medicine for her bleeding from the  hemologist. CB#[phone] She just wanted to let you know.

## 2023-05-28 NOTE — Telephone Encounter (Signed)
Pt has Von Willebrand disease. She is having surgery to repair her left achilles tendon. She came into clinic to ask Dr Melvyn Neth for refill and how to take it before surgery.  Pt is no longer under Dr Melvyn Neth' care, as he turned her care back over to her PCP (which is Dr Marianne Sofia). Dr Melvyn Neth is willing to send the Stimate in. She will take 1 spray 1 hour prior to surgery per Dr Melvyn Neth.  Pt notified of above and verbalized understanding.

## 2023-05-29 ENCOUNTER — Other Ambulatory Visit: Payer: Self-pay | Admitting: Family

## 2023-05-29 ENCOUNTER — Other Ambulatory Visit: Payer: Self-pay

## 2023-05-29 NOTE — Telephone Encounter (Signed)
I called and lm on vm to advise I was not sure what medication she was referencing and if she could please call back and leave a message with name of medication that she is wanting Dr. Lajoyce Corners to be aware that she is taking.

## 2023-05-30 ENCOUNTER — Telehealth: Payer: Self-pay | Admitting: Orthopedic Surgery

## 2023-05-30 NOTE — Telephone Encounter (Signed)
Pt called requesting Dr Lajoyce Corners fax a request for blood work to pt's PCP Dr Zack Seal fax number [phone].

## 2023-05-30 NOTE — Telephone Encounter (Signed)
Order and last OV note faxed to number provided below.

## 2023-06-05 ENCOUNTER — Telehealth: Payer: Self-pay | Admitting: Orthopedic Surgery

## 2023-06-05 NOTE — Telephone Encounter (Signed)
I faxed this last week. I will refax order.

## 2023-06-05 NOTE — Telephone Encounter (Signed)
faxed

## 2023-06-05 NOTE — Telephone Encounter (Signed)
Do you know abou tthis? Looks like the pt had an order last week. Is there anything that I need to do for this?

## 2023-06-05 NOTE — Telephone Encounter (Signed)
Stacey Stewart called from [facility] in [place]. She said she needs labwork  orders to have labwork done. CB# [phone] ext. [phone]   Fax #[phone]

## 2023-06-12 ENCOUNTER — Telehealth: Payer: Self-pay | Admitting: Orthopedic Surgery

## 2023-06-12 NOTE — Telephone Encounter (Signed)
Patient called and would like to know if she can get the spur cut down when she have the surgery. CB#[phone]

## 2023-06-12 NOTE — Telephone Encounter (Signed)
Please see below, she is being scheduled for achilles tendon reconstruction and wants to have a bone spur shaved down at same time.

## 2023-06-15 NOTE — Telephone Encounter (Signed)
I called and sw pt to advise of message below. She voiced understanding and is happy with this plan. Surgery is sch for 06/22/2023 she will call if she has an y other questions.

## 2023-06-20 NOTE — Progress Notes (Signed)
PCP - Buckner Malta, MD  Cardiologist - denies  PPM/ICD - denies Device Orders - n/a Rep Notified - n/a  Chest x-ray - denies EKG - DOS Stress Test - denies ECHO - denies Cardiac Cath - denies  CPAP - denies  GLP-1 -OZEMPIC, Last dose 06-17-23  DM- denies  Blood Thinner Instructions:  Aspirin Instructions:  desmopressin (DDAVP NASAL)  noted to use 2 hours prior to procedure  ERAS Protcol - Clear liquids until 6:30  COVID TEST- n/a  Anesthesia review: yes Hx of Htn and desmopressin (DDAVP NASAL) uses this medication prior to surgery   Patient verbally denies any shortness of breath, fever, cough and chest pain during phone call   -------------  SDW INSTRUCTIONS given:  Your procedure is scheduled on  June 22, 2023.  Report to [facility] Main Entrance "A" at 7:00 A.M., and check in at the Admitting office.  Call this number if you have problems the morning of surgery:  [phone]   Remember:  Do not eat after midnight the night before your surgery  You may drink clear liquids until 6:30 the morning of your surgery.   Clear liquids allowed are: Water, Non-Citrus Juices (without pulp), Carbonated Beverages, Clear Tea, Black Coffee Only, and Gatorade    Take these medicines the morning of surgery with A SIP OF WATER  pregabalin (LYRICA)  nitroGLYCERIN (NITRODUR - DOSED IN MG/24 HR)  cloNIDine (CATAPRES)  buPROPion (WELLBUTRIN XL)   IF NEEDED meclizine (ANTIVERT)  HYDROcodone-acetaminophen (NORCO)    As of today, STOP taking any Aspirin (unless otherwise instructed by your surgeon) Aleve, Naproxen, Ibuprofen, Motrin, Advil, Goody's, BC's, all herbal medications, fish oil, and all vitamins.                      Do not wear jewelry, make up, or nail polish            Do not wear lotions, powders, perfumes/colognes, or deodorant.            Do not shave 48 hours prior to surgery.  Men may shave face and neck.            Do not bring valuables to the  hospital.            [facility] is not responsible for any belongings or valuables.  Do NOT Smoke (Tobacco/Vaping) 24 hours prior to your procedure If you use a CPAP at night, you may bring all equipment for your overnight stay.   Contacts, glasses, dentures or bridgework may not be worn into surgery.      For patients admitted to the hospital, discharge time will be determined by your treatment team.   Patients discharged the day of surgery will not be allowed to drive home, and someone needs to stay with them for 24 hours.    Special instructions:   [place]- Preparing For Surgery  Before surgery, you can play an important role. Because skin is not sterile, your skin needs to be as free of germs as possible. You can reduce the number of germs on your skin by washing with CHG (chlorahexidine gluconate) Soap before surgery.  CHG is an antiseptic cleaner which kills germs and bonds with the skin to continue killing germs even after washing.    Oral Hygiene is also important to reduce your risk of infection.  Remember - BRUSH YOUR TEETH THE MORNING OF SURGERY WITH YOUR REGULAR TOOTHPASTE  Please do not use if you  have an allergy to CHG or antibacterial soaps. If your skin becomes reddened/irritated stop using the CHG.  Do not shave (including legs and underarms) for at least 48 hours prior to first CHG shower. It is OK to shave your face.  Please follow these instructions carefully.   Shower the NIGHT BEFORE SURGERY and the MORNING OF SURGERY with DIAL Soap.   Pat yourself dry with a CLEAN TOWEL.  Wear CLEAN PAJAMAS to bed the night before surgery  Place CLEAN SHEETS on your bed the night of your first shower and DO NOT SLEEP WITH PETS.   Day of Surgery: Please shower morning of surgery  Wear Clean/Comfortable clothing the morning of surgery Do not apply any deodorants/lotions.   Remember to brush your teeth WITH YOUR REGULAR TOOTHPASTE.   Questions were answered. Patient  verbalized understanding of instructions.

## 2023-06-21 ENCOUNTER — Other Ambulatory Visit: Payer: Self-pay

## 2023-06-21 ENCOUNTER — Encounter (HOSPITAL_COMMUNITY): Payer: Self-pay | Admitting: Orthopedic Surgery
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
## 2023-06-22 ENCOUNTER — Telehealth: Payer: Self-pay | Admitting: Orthopedic Surgery

## 2023-06-22 ENCOUNTER — Encounter (HOSPITAL_COMMUNITY): Payer: Self-pay | Admitting: Orthopedic Surgery

## 2023-06-22 ENCOUNTER — Ambulatory Visit (HOSPITAL_COMMUNITY): Payer: Medicaid Other | Admitting: Vascular Surgery

## 2023-06-22 ENCOUNTER — Other Ambulatory Visit: Payer: Self-pay

## 2023-06-22 ENCOUNTER — Encounter (HOSPITAL_COMMUNITY)
Admission: RE | Disposition: A | Discharge status: Home / Self Care | Payer: Self-pay | Source: Home / Self Care | Attending: Orthopedic Surgery

## 2023-06-22 ENCOUNTER — Ambulatory Visit (HOSPITAL_COMMUNITY)
Admission: RE | Admit: 2023-06-22 | Discharge: 2023-06-22 | Disposition: A | Discharge status: Home / Self Care | Payer: Medicaid Other | Attending: Orthopedic Surgery | Admitting: Orthopedic Surgery

## 2023-06-22 DIAGNOSIS — X58XXXD Exposure to other specified factors, subsequent encounter: Secondary | ICD-10-CM | POA: Diagnosis not present

## 2023-06-22 DIAGNOSIS — S86012A Strain of left Achilles tendon, initial encounter: Secondary | ICD-10-CM

## 2023-06-22 DIAGNOSIS — S86012D Strain of left Achilles tendon, subsequent encounter: Secondary | ICD-10-CM | POA: Insufficient documentation

## 2023-06-22 DIAGNOSIS — I10 Essential (primary) hypertension: Secondary | ICD-10-CM | POA: Insufficient documentation

## 2023-06-22 DIAGNOSIS — D6800 Von Willebrand disease, unspecified: Secondary | ICD-10-CM | POA: Insufficient documentation

## 2023-06-22 DIAGNOSIS — F17210 Nicotine dependence, cigarettes, uncomplicated: Secondary | ICD-10-CM

## 2023-06-22 DIAGNOSIS — F419 Anxiety disorder, unspecified: Secondary | ICD-10-CM | POA: Diagnosis not present

## 2023-06-22 DIAGNOSIS — F418 Other specified anxiety disorders: Secondary | ICD-10-CM

## 2023-06-22 DIAGNOSIS — S86022A Laceration of left Achilles tendon, initial encounter: Secondary | ICD-10-CM | POA: Diagnosis not present

## 2023-06-22 HISTORY — PX: ACHILLES TENDON SURGERY: SHX542

## 2023-06-22 HISTORY — DX: Gastro-esophageal reflux disease without esophagitis: K21.9

## 2023-06-22 HISTORY — DX: Unspecified osteoarthritis, unspecified site: M19.90

## 2023-06-22 LAB — CBC
HCT: 36.8 % (ref 36.0–46.0)
Hemoglobin: 12.1 g/dL (ref 12.0–15.0)
MCH: 29.3 pg (ref 26.0–34.0)
MCHC: 32.9 g/dL (ref 30.0–36.0)
MCV: 89.1 fL (ref 80.0–100.0)
Platelets: 124 10*3/uL — ABNORMAL LOW (ref 150–400)
RBC: 4.13 MIL/uL (ref 3.87–5.11)
RDW: 13.2 % (ref 11.5–15.5)
WBC: 5.3 10*3/uL (ref 4.0–10.5)
nRBC: 0 % (ref 0.0–0.2)

## 2023-06-22 LAB — BASIC METABOLIC PANEL
Anion gap: 9 (ref 5–15)
BUN: 11 mg/dL (ref 6–20)
CO2: 21 mmol/L — ABNORMAL LOW (ref 22–32)
Calcium: 9.2 mg/dL (ref 8.9–10.3)
Chloride: 109 mmol/L (ref 98–111)
Creatinine, Ser: 0.91 mg/dL (ref 0.44–1.00)
GFR, Estimated: >60 mL/min (ref >60)
Glucose, Bld: 83 mg/dL (ref 70–99)
Potassium: 4.8 mmol/L (ref 3.5–5.1)
Sodium: 139 mmol/L (ref 135–145)

## 2023-06-22 SURGERY — REPAIR, TENDON, ACHILLES
Anesthesia: Regional | Laterality: Left

## 2023-06-22 MED ORDER — CEFAZOLIN SODIUM-DEXTROSE 2-4 GM/100ML-% IV SOLN
INTRAVENOUS | Status: AC
  Filled 2023-06-22: qty 100

## 2023-06-22 MED ORDER — 0.9 % SODIUM CHLORIDE (POUR BTL) OPTIME
TOPICAL | Status: DC | PRN
  Administered 2023-06-22: 1000 mL

## 2023-06-22 MED ORDER — DEXAMETHASONE SODIUM PHOSPHATE 10 MG/ML IJ SOLN
INTRAMUSCULAR | Status: DC | PRN
  Administered 2023-06-22: 10 mg via INTRAVENOUS

## 2023-06-22 MED ORDER — PROPOFOL 10 MG/ML IV BOLUS
INTRAVENOUS | Status: DC | PRN
  Administered 2023-06-22: 200 mg via INTRAVENOUS

## 2023-06-22 MED ORDER — FENTANYL CITRATE (PF) 100 MCG/2ML IJ SOLN
INTRAMUSCULAR | Status: AC
  Administered 2023-06-22: 50 ug via INTRAVENOUS
  Filled 2023-06-22: qty 2

## 2023-06-22 MED ORDER — PROPOFOL 10 MG/ML IV BOLUS
INTRAVENOUS | Status: AC
  Filled 2023-06-22: qty 20

## 2023-06-22 MED ORDER — CHLORHEXIDINE GLUCONATE 0.12 % MT SOLN
OROMUCOSAL | Status: AC
  Administered 2023-06-22: 15 mL via OROMUCOSAL
  Filled 2023-06-22: qty 15

## 2023-06-22 MED ORDER — MIDAZOLAM HCL 2 MG/2ML IJ SOLN: 2.0000 mg | Freq: Once | INTRAMUSCULAR | Status: AC

## 2023-06-22 MED ORDER — MIDAZOLAM HCL 2 MG/2ML IJ SOLN
INTRAMUSCULAR | Status: AC
  Administered 2023-06-22: 2 mg via INTRAVENOUS
  Filled 2023-06-22: qty 2

## 2023-06-22 MED ORDER — FENTANYL CITRATE (PF) 250 MCG/5ML IJ SOLN
INTRAMUSCULAR | Status: AC
  Filled 2023-06-22: qty 5

## 2023-06-22 MED ORDER — FENTANYL CITRATE (PF) 100 MCG/2ML IJ SOLN: 25.0000 ug | INTRAMUSCULAR | Status: DC | PRN

## 2023-06-22 MED ORDER — FENTANYL CITRATE (PF) 250 MCG/5ML IJ SOLN
INTRAMUSCULAR | Status: DC | PRN
  Administered 2023-06-22: 50 ug via INTRAVENOUS
  Administered 2023-06-22: 100 ug via INTRAVENOUS

## 2023-06-22 MED ORDER — SODIUM CHLORIDE 0.9 % IV SOLN: INTRAVENOUS | Status: DC

## 2023-06-22 MED ORDER — CHLORHEXIDINE GLUCONATE 0.12 % MT SOLN: 15.0000 mL | Freq: Once | OROMUCOSAL | Status: AC

## 2023-06-22 MED ORDER — FENTANYL CITRATE (PF) 100 MCG/2ML IJ SOLN: 50.0000 ug | Freq: Once | INTRAMUSCULAR | Status: AC

## 2023-06-22 MED ORDER — ACETAMINOPHEN 500 MG PO TABS: 1000.0000 mg | ORAL_TABLET | Freq: Once | ORAL | Status: AC

## 2023-06-22 MED ORDER — ORAL CARE MOUTH RINSE
15.0000 mL | Freq: Once | OROMUCOSAL | Status: AC
Start: 2023-06-22 — End: 2023-06-22

## 2023-06-22 MED ORDER — CEFAZOLIN SODIUM-DEXTROSE 2-4 GM/100ML-% IV SOLN
2.0000 g | INTRAVENOUS | Status: AC
  Administered 2023-06-22: 2 g via INTRAVENOUS

## 2023-06-22 MED ORDER — LIDOCAINE 2% (20 MG/ML) 5 ML SYRINGE
INTRAMUSCULAR | Status: DC | PRN
  Administered 2023-06-22: 40 mg via INTRAVENOUS

## 2023-06-22 MED ORDER — ACETAMINOPHEN 500 MG PO TABS
ORAL_TABLET | ORAL | Status: AC
  Administered 2023-06-22: 1000 mg via ORAL
  Filled 2023-06-22: qty 2

## 2023-06-22 MED ORDER — OXYCODONE-ACETAMINOPHEN 5-325 MG PO TABS: 1.0000 | ORAL_TABLET | ORAL | 0 refills | Status: DC | PRN

## 2023-06-22 SURGICAL SUPPLY — 39 items
BAG COUNTER SPONGE SURGICOUNT (BAG) ×1 IMPLANT
BLADE SURG 10 STRL SS (BLADE) ×1 IMPLANT
BNDG COHESIVE 6X5 TAN NS LF (GAUZE/BANDAGES/DRESSINGS) IMPLANT
BNDG COHESIVE 6X5 TAN ST LF (GAUZE/BANDAGES/DRESSINGS) ×2 IMPLANT
BNDG ESMARK 4X9 LF (GAUZE/BANDAGES/DRESSINGS) IMPLANT
BNDG GAUZE DERMACEA FLUFF 4 (GAUZE/BANDAGES/DRESSINGS) ×1 IMPLANT
COVER SURGICAL LIGHT HANDLE (MISCELLANEOUS) ×1 IMPLANT
CUFF TOURN SGL QUICK 42 (TOURNIQUET CUFF) IMPLANT
CUFF TRNQT CYL 34X4.125X (TOURNIQUET CUFF) IMPLANT
DRAPE INCISE IOBAN 66X45 STRL (DRAPES) ×1 IMPLANT
DRAPE U-SHAPE 47X51 STRL (DRAPES) ×1 IMPLANT
DRSG ADAPTIC 3X8 NADH LF (GAUZE/BANDAGES/DRESSINGS) ×1 IMPLANT
DURAPREP 26ML APPLICATOR (WOUND CARE) ×1 IMPLANT
ELECT REM PT RETURN 9FT ADLT (ELECTROSURGICAL) ×1
ELECTRODE REM PT RTRN 9FT ADLT (ELECTROSURGICAL) ×1 IMPLANT
GAUZE PAD ABD 8X10 STRL (GAUZE/BANDAGES/DRESSINGS) IMPLANT
GAUZE SPONGE 4X4 12PLY STRL (GAUZE/BANDAGES/DRESSINGS) ×1 IMPLANT
GLOVE BIOGEL PI IND STRL 9 (GLOVE) ×1 IMPLANT
GLOVE SURG ORTHO 9.0 STRL STRW (GLOVE) ×1 IMPLANT
GOWN STRL REUS W/ TWL XL LVL3 (GOWN DISPOSABLE) ×3 IMPLANT
KIT TURNOVER KIT B (KITS) ×1 IMPLANT
MANIFOLD NEPTUNE II (INSTRUMENTS) ×1 IMPLANT
NDL SUT .5 MAYO 1.404X.05X (NEEDLE) ×1 IMPLANT
NS IRRIG 1000ML POUR BTL (IV SOLUTION) ×1 IMPLANT
PACK ORTHO EXTREMITY (CUSTOM PROCEDURE TRAY) ×1 IMPLANT
PAD ARMBOARD 7.5X6 YLW CONV (MISCELLANEOUS) ×2 IMPLANT
SPONGE T-LAP 18X18 ~~LOC~~+RFID (SPONGE) IMPLANT
SPONGE T-LAP 4X18 ~~LOC~~+RFID (SPONGE) ×2 IMPLANT
SUT ETHILON 2 0 PSLX (SUTURE) IMPLANT
SUT ETHILON 3 0 FSLX (SUTURE) ×1 IMPLANT
SUT FIBERWIRE #2 38 T-5 BLUE (SUTURE)
SUT MNCRL AB 3-0 PS2 18 (SUTURE) ×1 IMPLANT
SUT VIC AB 1 CT1 27XBRD ANBCTR (SUTURE) IMPLANT
SUTURE FIBERWR #2 38 T-5 BLUE (SUTURE) ×4 IMPLANT
TOWEL GREEN STERILE (TOWEL DISPOSABLE) ×1 IMPLANT
TOWEL GREEN STERILE FF (TOWEL DISPOSABLE) ×1 IMPLANT
TUBE CONNECTING 12X1/4 (SUCTIONS) ×1 IMPLANT
WATER STERILE IRR 1000ML POUR (IV SOLUTION) ×1 IMPLANT
YANKAUER SUCT BULB TIP NO VENT (SUCTIONS) ×1 IMPLANT

## 2023-06-22 NOTE — Op Note (Signed)
06/22/2023  10:42 AM  PATIENT:  Stacey Stewart    PRE-OPERATIVE DIAGNOSIS:  Chronic Tear Left Achilles  POST-OPERATIVE DIAGNOSIS:  Same  PROCEDURE:  LEFT ACHILLES EXCISE NON UNION AND SECONDARY RECONSTRUCTION  SURGEON:  [name], MD  PHYSICIAN ASSISTANT:None ANESTHESIA:   General  PREOPERATIVE INDICATIONS:  [name] is a  57 y.o. female with a diagnosis of Chronic Tear Left Achilles who failed conservative measures and elected for surgical management.    The risks benefits and alternatives were discussed with the patient preoperatively including but not limited to the risks of infection, bleeding, nerve injury, cardiopulmonary complications, the need for revision surgery, among others, and the patient was willing to proceed.  OPERATIVE IMPLANTS:   * No implants in log *  @ENCIMAGES @  OPERATIVE FINDINGS: Operative findings showed chronic degenerative tearing of the Achilles tendon.  There was calcification within the tendon distally however this was integral to the Achilles tendon and was not resected.  OPERATIVE PROCEDURE: Patient brought the operating room and underwent a regional block.  After adequate levels anesthesia were obtained patient's left lower extremity was prepped using DuraPrep draped into a sterile field a timeout was called.  A posterior medial longitudinal incision was made carried down through the peritenon of the Achilles tendon.  Visualization showed a segment of approximately 3 cm of degenerative necrotic Achilles tendon from a chronic tear.  The necrotic section was excised.  Distally there was some calcification at the insertion of the Achilles this was integral to the insertion and was not resected.  Using Krakw technique 4 sutures were placed proximally and distally with 4 strands exiting the distal and proximal stump.  The foot was placed in plantarflexion and the sutures were repaired mid substance.  Patient had good dorsiflexion to neutral with  intact Achilles repair.  The wound was irrigated with normal saline the retinaculum and skin was closed using 2-0 nylon.  Sterile dressing was applied a heel lift was applied patient was extubated taken the PACU in stable condition.   DISCHARGE PLANNING:  Antibiotic duration: Preoperative antibiotics  Weightbearing: Touchdown weightbearing on the left  Pain medication: Prescription Percocet  Dressing care/ Wound VAC: Dry dressing  Ambulatory devices: Crutches  Discharge to: Home.  Follow-up: In the office 1 week post operative.

## 2023-06-22 NOTE — Interval H&P Note (Signed)
History and Physical Interval Note:  06/22/2023 10:38 AM  Stacey Stewart  has presented today for surgery, with the diagnosis of Chronic Tear Left Achilles.  The various methods of treatment have been discussed with the patient and family. After consideration of risks, benefits and other options for treatment, the patient has consented to  Procedure(s): LEFT ACHILLES EXCISE NON UNION AND RECONSTRUCTION (Left) as a surgical intervention.  The patient's history has been reviewed, patient examined, no change in status, stable for surgery.  I have reviewed the patient's chart and labs.  Questions were answered to the patient's satisfaction.     [name]

## 2023-06-22 NOTE — Telephone Encounter (Signed)
Lm on VM that we do not send in orders for scooters. Insurance does not cover this DME. She will have to pay out of pocket OR rent one. Can call a DME store and see the best option.

## 2023-06-22 NOTE — Transfer of Care (Signed)
Immediate Anesthesia Transfer of Care Note  Patient: Marcele Avilez Reardon  Procedure(s) Performed: LEFT ACHILLES EXCISE NON UNION AND RECONSTRUCTION (Left)  Patient Location: PACU  Anesthesia Type:General  Level of Consciousness: sedated  Airway & Oxygen Therapy: Patient Spontanous Breathing  Post-op Assessment: Report given to RN  Post vital signs: Reviewed and stable  Last Vitals:  Vitals Value Taken Time  BP 127/86 06/22/23 1053  Temp 36.9 C 06/22/23 1055  Pulse 85 06/22/23 1056  Resp 11 06/22/23 1056  SpO2 100 % 06/22/23 1056  Vitals shown include unfiled device data.  Last Pain:  Vitals:   06/22/23 1055  TempSrc:   PainSc: Asleep         Complications: No notable events documented.

## 2023-06-22 NOTE — Telephone Encounter (Signed)
Letter signed and emailed to address below.

## 2023-06-22 NOTE — Telephone Encounter (Signed)
Patient called. Would like a note stating that she had surgery and the date to return to work. Would like it emailed to [email or web address]

## 2023-06-22 NOTE — Progress Notes (Signed)
Orthopedic Tech Progress Note Patient Details:  Nidhi Meccia [birth year]al Dec 24, 1965 [id number]  Ortho Devices Type of Ortho Device: CAM walker Ortho Device/Splint Location: LLE Ortho Device/Splint Interventions: Ordered, Application, Adjustment   Post Interventions Patient Tolerated: Well  Lamarr Feenstra A Thos Matsumoto 06/22/2023, 11:22 AM

## 2023-06-22 NOTE — Telephone Encounter (Signed)
Patient called asked if she can get a Rx for a scooter? The number to contact patient is [phone]

## 2023-06-22 NOTE — Telephone Encounter (Signed)
SW pt, she says that Dr. Lajoyce Corners told her she would be out of work for about 2 months. I will get the letter written and emailed to her.
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
## 2023-06-23 MED ORDER — ROPIVACAINE HCL 5 MG/ML IJ SOLN
INTRAMUSCULAR | Status: DC | PRN
  Administered 2023-06-22: 15 mL via PERINEURAL
  Administered 2023-06-22: 30 mL via PERINEURAL

## 2023-06-23 MED ORDER — DEXAMETHASONE SODIUM PHOSPHATE 10 MG/ML IJ SOLN
INTRAMUSCULAR | Status: DC | PRN
  Administered 2023-06-22 (×2): 5 mg

## 2023-06-23 NOTE — Anesthesia Procedure Notes (Signed)
Anesthesia Regional Block: Adductor canal block   Pre-Anesthetic Checklist: , timeout performed,  Correct Patient, Correct Site, Correct Laterality,  Correct Procedure, Correct Position, site marked,  Risks and benefits discussed,  Pre-op evaluation,  At surgeon's request and post-op pain management  Laterality: Left  Prep: Maximum Sterile Barrier Precautions used, chloraprep       Needles:  Injection technique: Single-shot  Needle Type: Echogenic Stimulator Needle     Needle Length: 9cm  Needle Gauge: 21     Additional Needles:   Procedures:,,,, ultrasound used (permanent image in chart),,    Narrative:  Start time: 06/23/2023 8:41 AM End time: 06/23/2023 8:44 AM Injection made incrementally with aspirations every 5 mL. Anesthesiologist: Elmer Picker, MD

## 2023-06-23 NOTE — Anesthesia Postprocedure Evaluation (Signed)
Anesthesia Post Note  Patient: Stacey Stewart Born  Procedure(s) Performed: LEFT ACHILLES EXCISE NON UNION AND RECONSTRUCTION (Left)     Patient location during evaluation: PACU Anesthesia Type: Regional and General Level of consciousness: awake and alert Pain management: pain level controlled Vital Signs Assessment: post-procedure vital signs reviewed and stable Respiratory status: spontaneous breathing, nonlabored ventilation, respiratory function stable and patient connected to nasal cannula oxygen Cardiovascular status: blood pressure returned to baseline and stable Postop Assessment: no apparent nausea or vomiting Anesthetic complications: no  No notable events documented.  Last Vitals:  Vitals:   06/22/23 1120 06/22/23 1125  BP:  133/73  Pulse: 71 74  Resp: 13 15  Temp:  36.9 C  SpO2: 98% 98%    Last Pain:  Vitals:   06/22/23 1125  TempSrc:   PainSc: 0-No pain   Pain Goal:                   Onnie Alatorre L Domanic Matusek

## 2023-06-23 NOTE — Anesthesia Procedure Notes (Signed)
Anesthesia Regional Block: Popliteal block   Pre-Anesthetic Checklist: , timeout performed,  Correct Patient, Correct Site, Correct Laterality,  Correct Procedure, Correct Position, site marked,  Risks and benefits discussed,  Pre-op evaluation,  At surgeon's request and post-op pain management  Laterality: Left  Prep: Maximum Sterile Barrier Precautions used, chloraprep       Needles:  Injection technique: Single-shot  Needle Type: Echogenic Stimulator Needle     Needle Length: 9cm  Needle Gauge: 21     Additional Needles:   Procedures:,,,, ultrasound used (permanent image in chart),,    Narrative:  Start time: 06/23/2023 8:35 AM End time: 06/23/2023 8:41 AM Injection made incrementally with aspirations every 5 mL. Anesthesiologist: Elmer Picker, MD

## 2023-06-24 ENCOUNTER — Encounter (HOSPITAL_COMMUNITY): Payer: Self-pay | Admitting: Orthopedic Surgery

## 2023-07-02 ENCOUNTER — Ambulatory Visit (INDEPENDENT_AMBULATORY_CARE_PROVIDER_SITE_OTHER): Payer: Medicaid Other | Admitting: Orthopedic Surgery

## 2023-07-02 ENCOUNTER — Encounter: Payer: Self-pay | Admitting: Orthopedic Surgery

## 2023-07-02 DIAGNOSIS — S86012S Strain of left Achilles tendon, sequela: Secondary | ICD-10-CM
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
## 2023-07-06 ENCOUNTER — Other Ambulatory Visit: Payer: Self-pay | Admitting: Oncology

## 2023-07-09 ENCOUNTER — Encounter: Payer: Medicaid Other | Admitting: Orthopedic Surgery

## 2023-07-10 ENCOUNTER — Ambulatory Visit (INDEPENDENT_AMBULATORY_CARE_PROVIDER_SITE_OTHER): Payer: Medicaid Other | Admitting: Orthopedic Surgery

## 2023-07-10 DIAGNOSIS — S86012S Strain of left Achilles tendon, sequela: Secondary | ICD-10-CM

## 2023-07-10 MED ORDER — DOXYCYCLINE HYCLATE 100 MG PO TABS: 100.0000 mg | ORAL_TABLET | Freq: Two times a day (BID) | ORAL | 0 refills | Status: DC

## 2023-07-10 MED ORDER — MUPIROCIN 2 % EX OINT: 1.0000 | TOPICAL_OINTMENT | Freq: Two times a day (BID) | CUTANEOUS | 3 refills | Status: DC

## 2023-07-16 ENCOUNTER — Encounter: Payer: Self-pay | Admitting: Orthopedic Surgery

## 2023-07-16 ENCOUNTER — Ambulatory Visit (INDEPENDENT_AMBULATORY_CARE_PROVIDER_SITE_OTHER): Payer: Medicaid Other | Admitting: Orthopedic Surgery

## 2023-07-16 ENCOUNTER — Encounter: Payer: Medicaid Other | Admitting: Orthopedic Surgery

## 2023-07-16 DIAGNOSIS — S86012S Strain of left Achilles tendon, sequela: Secondary | ICD-10-CM
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
## 2023-07-24 ENCOUNTER — Encounter: Payer: Medicaid Other | Admitting: Family

## 2023-07-24 ENCOUNTER — Ambulatory Visit (INDEPENDENT_AMBULATORY_CARE_PROVIDER_SITE_OTHER): Payer: Medicaid Other | Admitting: Orthopedic Surgery

## 2023-07-24 ENCOUNTER — Encounter: Payer: Self-pay | Admitting: Orthopedic Surgery

## 2023-07-24 DIAGNOSIS — S86012S Strain of left Achilles tendon, sequela: Secondary | ICD-10-CM
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
## 2023-07-30 ENCOUNTER — Ambulatory Visit (INDEPENDENT_AMBULATORY_CARE_PROVIDER_SITE_OTHER): Payer: Medicaid Other | Admitting: Orthopedic Surgery

## 2023-07-30 DIAGNOSIS — S86012S Strain of left Achilles tendon, sequela: Secondary | ICD-10-CM

## 2023-08-06 ENCOUNTER — Encounter: Payer: Medicaid Other | Admitting: Orthopedic Surgery

## 2023-08-07 ENCOUNTER — Encounter: Payer: Self-pay | Admitting: Family

## 2023-08-07 ENCOUNTER — Ambulatory Visit (INDEPENDENT_AMBULATORY_CARE_PROVIDER_SITE_OTHER): Payer: Medicaid Other | Admitting: Family

## 2023-08-07 ENCOUNTER — Encounter: Payer: Self-pay | Admitting: Orthopedic Surgery

## 2023-08-07 DIAGNOSIS — S86012A Strain of left Achilles tendon, initial encounter: Secondary | ICD-10-CM
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
## 2023-08-08 ENCOUNTER — Other Ambulatory Visit (HOSPITAL_BASED_OUTPATIENT_CLINIC_OR_DEPARTMENT_OTHER): Payer: Self-pay | Admitting: Family Medicine

## 2023-08-08 ENCOUNTER — Encounter: Payer: Medicaid Other | Admitting: Family

## 2023-08-08 ENCOUNTER — Telehealth: Payer: Self-pay | Admitting: Orthopedic Surgery

## 2023-08-08 DIAGNOSIS — Z1231 Encounter for screening mammogram for malignant neoplasm of breast: Secondary | ICD-10-CM

## 2023-08-08 NOTE — Telephone Encounter (Signed)
 I called and lm on vm to advise pt that she can come this afternoon to pick up replacement but the mail box is full. I will hold message and try again later.

## 2023-08-08 NOTE — Telephone Encounter (Signed)
 Pt is on her way to pick up
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
## 2023-08-09 ENCOUNTER — Ambulatory Visit (HOSPITAL_BASED_OUTPATIENT_CLINIC_OR_DEPARTMENT_OTHER)
Admission: RE | Admit: 2023-08-09 | Discharge: 2023-08-09 | Disposition: A | Discharge status: Home / Self Care | Payer: Medicaid Other | Source: Ambulatory Visit | Attending: Family Medicine | Admitting: Family Medicine

## 2023-08-09 ENCOUNTER — Encounter (HOSPITAL_BASED_OUTPATIENT_CLINIC_OR_DEPARTMENT_OTHER): Payer: Self-pay | Admitting: Radiology

## 2023-08-09 DIAGNOSIS — Z1231 Encounter for screening mammogram for malignant neoplasm of breast: Secondary | ICD-10-CM

## 2023-08-16 ENCOUNTER — Ambulatory Visit: Payer: Medicaid Other | Admitting: Orthopedic Surgery

## 2023-08-16 DIAGNOSIS — S86012A Strain of left Achilles tendon, initial encounter: Secondary | ICD-10-CM

## 2023-08-17 ENCOUNTER — Encounter: Payer: Self-pay | Admitting: Orthopedic Surgery
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
## 2023-08-20 ENCOUNTER — Telehealth: Payer: Self-pay | Admitting: Family

## 2023-08-20 NOTE — Telephone Encounter (Signed)
SW pt, I advised okay to wash with dial soap and okay to use Vashe wound solution on wound as well. She has an apt tomorrow for follow up.

## 2023-08-20 NOTE — Telephone Encounter (Signed)
Patient called asked if she should apply the wound solution to the side of her foot? Patient said the graft did not take and her foot is draining. The number to contact patient is [phone]

## 2023-08-21 ENCOUNTER — Encounter: Payer: Self-pay | Admitting: Family

## 2023-08-21 ENCOUNTER — Ambulatory Visit (INDEPENDENT_AMBULATORY_CARE_PROVIDER_SITE_OTHER): Payer: Medicaid Other | Admitting: Family

## 2023-08-21 DIAGNOSIS — S86012A Strain of left Achilles tendon, initial encounter: Secondary | ICD-10-CM

## 2023-08-21 NOTE — Procedures (Signed)
 INJECTION/ASPIRATION    DATE:  06/08/2023    PROCEDURE:  Aspiration and injection ganglion right    INFORMED CONSENT: The nature of treatment and its benefits were discussed with the patient.  Alternative treatments (or no treatment) were reviewed. Risks include, but are not limited to: reaction to medications (local and systemic), nerve injury, injury to blood vessels, tendon rupture, residual pain, infection, numbness, bleeding and recurrent symptoms.  Although the benefits are judged to outweigh the risks, should any of these complications occur, any of them could be permanent. No guarantees of success or outcome were given or implied.  They stated they had no further questions and agreed to proceed.     The location and laterality of the procedure were confirmed prior to the start of the noted procedures.    SITE:  base 5th MT right foot     PROCEDURE: The area was prepped with an alcohol.  the area was locally anesthetized utilizing 2 cc of 1% lidocaine plain.  Using sterile technique, an 18-gauge needle was introduced to the cyst and aspiration performed.  This was followed by infiltration of 1 cc of Kenalog 40 with 1 cc of marcaine within the cyst.    SPECIMEN:  Thick gelatinous clear viscous fluid consistent with synovial fluid    COMPLICATIONS: There were no complications immediately or following the procedure. The patient tolerated the procedure well.     STANDARD INSTRUCTIONS: The patient was instructed on icing, activity resumption, the use of over the counter pain medications, and the symptoms to call for and expect.
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
## 2023-08-22 ENCOUNTER — Encounter (HOSPITAL_COMMUNITY): Payer: Self-pay | Admitting: Orthopedic Surgery

## 2023-08-22 NOTE — Progress Notes (Signed)
SDW CALL  Patient was given pre-op instructions over the phone. The opportunity was given for the patient to ask questions. No further questions asked. Patient verbalized understanding of instructions given.   PCP - Buckner Malta, MD  Cardiologist -   PPM/ICD - denies Device Orders - n/a Rep Notified - n/a  Chest x-ray -  EKG - 06-21-24 Stress Test -  ECHO -  Cardiac Cath -   Sleep Study - denies CPAP - n/a  DM- denies  Last dose of GLP1 agonist-  Semaglutide Last Dose 08-19-23 GLP1 instructions:   Blood Thinner Instructions:denies Aspirin Instructions:n/a  ERAS Protcol -NPO PRE-SURGERY   COVID TEST-    Anesthesia review: yes  Patient denies shortness of breath, fever, cough and chest pain over the phone call   All instructions explained to the patient, with a verbal understanding of the material. Patient agrees to go over the instructions while at home for a better understanding.

## 2023-08-23 ENCOUNTER — Encounter: Payer: Medicaid Other | Admitting: Orthopedic Surgery
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
## 2023-08-23 NOTE — Anesthesia Preprocedure Evaluation (Addendum)
Anesthesia Evaluation  Patient identified by MRN, date of birth, ID band Patient awake    Reviewed: Allergy & Precautions, NPO status , Patient's Chart, lab work & pertinent test results  History of Anesthesia Complications Negative for: history of anesthetic complications  Airway Mallampati: III  TM Distance: >3 FB Neck ROM: Full   Comment: H/o trach Dental  (+) Dental Advisory Given Denies loose top teeth. Has 8 teeth on the bottom. Denies loose teeth.:   Pulmonary neg shortness of breath, neg sleep apnea, neg COPD, neg recent URI, Current Smoker and Patient abstained from smoking., former smoker   Pulmonary exam normal breath sounds clear to auscultation       Cardiovascular hypertension (clonidine, lisinopril-HCTZ), Pt. on medications (-) angina (-) Past MI, (-) Cardiac Stents and (-) CABG (-) dysrhythmias  Rhythm:Regular Rate:Normal  HLD   Neuro/Psych  Headaches, neg Seizures PSYCHIATRIC DISORDERS Anxiety Depression    BPPV, short-term memory loss, h/o cerebral hemorrhage  Neuromuscular disease (chronic bilateral low back pain)    GI/Hepatic Neg liver ROS,GERD  ,,  Endo/Other  negative endocrine ROS    Renal/GU negative Renal ROS     Musculoskeletal  (+) Arthritis ,    Abdominal   Peds  Hematology  (+) Blood dyscrasia (von Willebrand disease) Lab Results      Component                Value               Date                      WBC                      5.3                 06/22/2023                HGB                      12.1                06/22/2023                HCT                      36.8                06/22/2023                MCV                      89.1                06/22/2023                PLT                      124 (L)             06/22/2023              Anesthesia Other Findings Last semaglutide: 08/19/2023  Took DDAVP in preop.  Reproductive/Obstetrics                              Anesthesia Physical Anesthesia Plan  ASA: 3  Anesthesia Plan: General  Post-op Pain Management: Tylenol PO (pre-op)*   Induction: Intravenous  PONV Risk Score and Plan: 2 and Ondansetron, Dexamethasone and Treatment may vary due to age or medical condition  Airway Management Planned: LMA  Additional Equipment:   Intra-op Plan:   Post-operative Plan: Extubation in OR  Informed Consent: I have reviewed the patients History and Physical, chart, labs and discussed the procedure including the risks, benefits and alternatives for the proposed anesthesia with the patient or authorized representative who has indicated his/her understanding and acceptance.     Dental advisory given  Plan Discussed with: CRNA and Anesthesiologist  Anesthesia Plan Comments: (PAT note written 08/23/2023 by Shonna Chock, PA-C. She has DDAVP nasal spray as prescribed with recent achilles surgery.   Risks of general anesthesia discussed including, but not limited to, sore throat, hoarse voice, chipped/damaged teeth, injury to vocal cords, nausea and vomiting, allergic reactions, lung infection, heart attack, stroke, and death. All questions answered.  )       Anesthesia Quick Evaluation

## 2023-08-24 ENCOUNTER — Ambulatory Visit (HOSPITAL_COMMUNITY): Payer: Self-pay | Admitting: Vascular Surgery

## 2023-08-24 ENCOUNTER — Ambulatory Visit (HOSPITAL_BASED_OUTPATIENT_CLINIC_OR_DEPARTMENT_OTHER): Payer: Self-pay | Admitting: Vascular Surgery

## 2023-08-24 ENCOUNTER — Ambulatory Visit (HOSPITAL_COMMUNITY)
Admission: RE | Admit: 2023-08-24 | Discharge: 2023-08-24 | Disposition: A | Discharge status: Home / Self Care | Payer: Medicaid Other | Attending: Orthopedic Surgery | Admitting: Orthopedic Surgery

## 2023-08-24 ENCOUNTER — Other Ambulatory Visit: Payer: Self-pay

## 2023-08-24 ENCOUNTER — Encounter (HOSPITAL_COMMUNITY)
Admission: RE | Disposition: A | Discharge status: Home / Self Care | Payer: Self-pay | Source: Home / Self Care | Attending: Orthopedic Surgery

## 2023-08-24 ENCOUNTER — Encounter (HOSPITAL_COMMUNITY): Payer: Self-pay | Admitting: Orthopedic Surgery

## 2023-08-24 DIAGNOSIS — Z8673 Personal history of transient ischemic attack (TIA), and cerebral infarction without residual deficits: Secondary | ICD-10-CM | POA: Diagnosis not present

## 2023-08-24 DIAGNOSIS — T81328A Disruption or dehiscence of closure of other specified internal operation (surgical) wound, initial encounter: Secondary | ICD-10-CM

## 2023-08-24 DIAGNOSIS — G8929 Other chronic pain: Secondary | ICD-10-CM | POA: Diagnosis not present

## 2023-08-24 DIAGNOSIS — M65072 Abscess of tendon sheath, left ankle and foot: Secondary | ICD-10-CM

## 2023-08-24 DIAGNOSIS — T8131XA Disruption of external operation (surgical) wound, not elsewhere classified, initial encounter: Secondary | ICD-10-CM | POA: Diagnosis present

## 2023-08-24 DIAGNOSIS — Z79899 Other long term (current) drug therapy: Secondary | ICD-10-CM | POA: Insufficient documentation

## 2023-08-24 DIAGNOSIS — F418 Other specified anxiety disorders: Secondary | ICD-10-CM

## 2023-08-24 DIAGNOSIS — G709 Myoneural disorder, unspecified: Secondary | ICD-10-CM | POA: Diagnosis not present

## 2023-08-24 DIAGNOSIS — I10 Essential (primary) hypertension: Secondary | ICD-10-CM | POA: Diagnosis not present

## 2023-08-24 DIAGNOSIS — Z87820 Personal history of traumatic brain injury: Secondary | ICD-10-CM | POA: Diagnosis not present

## 2023-08-24 DIAGNOSIS — K219 Gastro-esophageal reflux disease without esophagitis: Secondary | ICD-10-CM | POA: Diagnosis not present

## 2023-08-24 DIAGNOSIS — M1990 Unspecified osteoarthritis, unspecified site: Secondary | ICD-10-CM | POA: Insufficient documentation

## 2023-08-24 DIAGNOSIS — D6800 Von Willebrand disease, unspecified: Secondary | ICD-10-CM | POA: Insufficient documentation

## 2023-08-24 DIAGNOSIS — Y838 Other surgical procedures as the cause of abnormal reaction of the patient, or of later complication, without mention of misadventure at the time of the procedure: Secondary | ICD-10-CM | POA: Insufficient documentation

## 2023-08-24 DIAGNOSIS — F17210 Nicotine dependence, cigarettes, uncomplicated: Secondary | ICD-10-CM | POA: Diagnosis not present

## 2023-08-24 HISTORY — PX: I & D EXTREMITY: SHX5045

## 2023-08-24 LAB — BASIC METABOLIC PANEL
Anion gap: 9 (ref 5–15)
BUN: 24 mg/dL — ABNORMAL HIGH (ref 6–20)
CO2: 23 mmol/L (ref 22–32)
Calcium: 8.9 mg/dL (ref 8.9–10.3)
Chloride: 104 mmol/L (ref 98–111)
Creatinine, Ser: 1.14 mg/dL — ABNORMAL HIGH (ref 0.44–1.00)
GFR, Estimated: 57 mL/min — ABNORMAL LOW (ref >60)
Glucose, Bld: 78 mg/dL (ref 70–99)
Potassium: 3.9 mmol/L (ref 3.5–5.1)
Sodium: 136 mmol/L (ref 135–145)

## 2023-08-24 LAB — CBC
HCT: 33.7 % — ABNORMAL LOW (ref 36.0–46.0)
Hemoglobin: 11.1 g/dL — ABNORMAL LOW (ref 12.0–15.0)
MCH: 29.2 pg (ref 26.0–34.0)
MCHC: 32.9 g/dL (ref 30.0–36.0)
MCV: 88.7 fL (ref 80.0–100.0)
Platelets: 169 10*3/uL (ref 150–400)
RBC: 3.8 MIL/uL — ABNORMAL LOW (ref 3.87–5.11)
RDW: 13.2 % (ref 11.5–15.5)
WBC: 7.7 10*3/uL (ref 4.0–10.5)
nRBC: 0 % (ref 0.0–0.2)

## 2023-08-24 SURGERY — IRRIGATION AND DEBRIDEMENT EXTREMITY
Anesthesia: General | Laterality: Left

## 2023-08-24 MED ORDER — ONDANSETRON HCL 4 MG/2ML IJ SOLN
INTRAMUSCULAR | Status: AC
  Filled 2023-08-24: qty 2

## 2023-08-24 MED ORDER — FENTANYL CITRATE (PF) 100 MCG/2ML IJ SOLN
INTRAMUSCULAR | Status: AC
  Filled 2023-08-24: qty 2

## 2023-08-24 MED ORDER — MIDAZOLAM HCL 2 MG/2ML IJ SOLN
INTRAMUSCULAR | Status: AC
  Filled 2023-08-24: qty 2

## 2023-08-24 MED ORDER — AMISULPRIDE (ANTIEMETIC) 5 MG/2ML IV SOLN: 10.0000 mg | Freq: Once | INTRAVENOUS | Status: DC | PRN

## 2023-08-24 MED ORDER — SODIUM CHLORIDE 0.9 % IV SOLN: INTRAVENOUS | Status: DC

## 2023-08-24 MED ORDER — VANCOMYCIN HCL 1000 MG IV SOLR
INTRAVENOUS | Status: DC | PRN
  Administered 2023-08-24: 1000 mg

## 2023-08-24 MED ORDER — CHLORHEXIDINE GLUCONATE 0.12 % MT SOLN: 15.0000 mL | Freq: Once | OROMUCOSAL | Status: AC

## 2023-08-24 MED ORDER — CEFAZOLIN SODIUM-DEXTROSE 2-4 GM/100ML-% IV SOLN
2.0000 g | INTRAVENOUS | Status: AC
  Administered 2023-08-24: 2 g via INTRAVENOUS
  Filled 2023-08-24: qty 100

## 2023-08-24 MED ORDER — VASHE WOUND IRRIGATION OPTIME
TOPICAL | Status: DC | PRN
  Administered 2023-08-24: 34 [oz_av] via TOPICAL

## 2023-08-24 MED ORDER — DEXAMETHASONE SODIUM PHOSPHATE 10 MG/ML IJ SOLN
INTRAMUSCULAR | Status: AC
  Filled 2023-08-24: qty 1

## 2023-08-24 MED ORDER — LIDOCAINE 2% (20 MG/ML) 5 ML SYRINGE
INTRAMUSCULAR | Status: DC | PRN
  Administered 2023-08-24: 60 mg via INTRAVENOUS

## 2023-08-24 MED ORDER — PHENYLEPHRINE 80 MCG/ML (10ML) SYRINGE FOR IV PUSH (FOR BLOOD PRESSURE SUPPORT)
PREFILLED_SYRINGE | INTRAVENOUS | Status: DC | PRN
  Administered 2023-08-24: 160 ug via INTRAVENOUS
  Administered 2023-08-24: 240 ug via INTRAVENOUS

## 2023-08-24 MED ORDER — FENTANYL CITRATE (PF) 250 MCG/5ML IJ SOLN
INTRAMUSCULAR | Status: DC | PRN
  Administered 2023-08-24: 50 ug via INTRAVENOUS

## 2023-08-24 MED ORDER — PROPOFOL 10 MG/ML IV BOLUS
INTRAVENOUS | Status: AC
  Filled 2023-08-24: qty 20

## 2023-08-24 MED ORDER — LACTATED RINGERS IV SOLN: INTRAVENOUS | Status: DC

## 2023-08-24 MED ORDER — LIDOCAINE 2% (20 MG/ML) 5 ML SYRINGE
INTRAMUSCULAR | Status: AC
  Filled 2023-08-24: qty 5

## 2023-08-24 MED ORDER — PROPOFOL 10 MG/ML IV BOLUS
INTRAVENOUS | Status: DC | PRN
  Administered 2023-08-24: 150 mg via INTRAVENOUS

## 2023-08-24 MED ORDER — PHENYLEPHRINE 80 MCG/ML (10ML) SYRINGE FOR IV PUSH (FOR BLOOD PRESSURE SUPPORT)
PREFILLED_SYRINGE | INTRAVENOUS | Status: AC
  Filled 2023-08-24: qty 10

## 2023-08-24 MED ORDER — VANCOMYCIN HCL 1000 MG IV SOLR
INTRAVENOUS | Status: AC
  Filled 2023-08-24: qty 20

## 2023-08-24 MED ORDER — CHLORHEXIDINE GLUCONATE 0.12 % MT SOLN
OROMUCOSAL | Status: AC
  Administered 2023-08-24: 15 mL via OROMUCOSAL
  Filled 2023-08-24: qty 15

## 2023-08-24 MED ORDER — OXYCODONE HCL 5 MG/5ML PO SOLN: 5.0000 mg | Freq: Once | ORAL | Status: AC | PRN

## 2023-08-24 MED ORDER — ACETAMINOPHEN 500 MG PO TABS
1000.0000 mg | ORAL_TABLET | Freq: Once | ORAL | Status: DC
  Filled 2023-08-24: qty 2

## 2023-08-24 MED ORDER — 0.9 % SODIUM CHLORIDE (POUR BTL) OPTIME
TOPICAL | Status: DC | PRN
  Administered 2023-08-24: 1000 mL

## 2023-08-24 MED ORDER — ONDANSETRON HCL 4 MG/2ML IJ SOLN
INTRAMUSCULAR | Status: DC | PRN
  Administered 2023-08-24: 4 mg via INTRAVENOUS

## 2023-08-24 MED ORDER — MIDAZOLAM HCL 2 MG/2ML IJ SOLN
INTRAMUSCULAR | Status: DC | PRN
  Administered 2023-08-24: 2 mg via INTRAVENOUS

## 2023-08-24 MED ORDER — ACETAMINOPHEN 500 MG PO TABS
500.0000 mg | ORAL_TABLET | Freq: Once | ORAL | Status: AC
  Administered 2023-08-24: 500 mg via ORAL

## 2023-08-24 MED ORDER — OXYCODONE HCL 5 MG PO TABS
ORAL_TABLET | ORAL | Status: AC
  Filled 2023-08-24: qty 1

## 2023-08-24 MED ORDER — FENTANYL CITRATE (PF) 250 MCG/5ML IJ SOLN
INTRAMUSCULAR | Status: AC
  Filled 2023-08-24: qty 5

## 2023-08-24 MED ORDER — ORAL CARE MOUTH RINSE: 15.0000 mL | Freq: Once | OROMUCOSAL | Status: AC

## 2023-08-24 MED ORDER — OXYCODONE HCL 5 MG PO TABS
5.0000 mg | ORAL_TABLET | Freq: Once | ORAL | Status: AC | PRN
  Administered 2023-08-24: 5 mg via ORAL

## 2023-08-24 MED ORDER — DEXAMETHASONE SODIUM PHOSPHATE 10 MG/ML IJ SOLN
INTRAMUSCULAR | Status: DC | PRN
  Administered 2023-08-24: 10 mg via INTRAVENOUS

## 2023-08-24 MED ORDER — FENTANYL CITRATE (PF) 100 MCG/2ML IJ SOLN
25.0000 ug | INTRAMUSCULAR | Status: DC | PRN
  Administered 2023-08-24 (×3): 25 ug via INTRAVENOUS

## 2023-08-24 MED ORDER — HYDROCODONE-ACETAMINOPHEN 5-325 MG PO TABS: 1.0000 | ORAL_TABLET | ORAL | 0 refills | Status: AC | PRN

## 2023-08-24 SURGICAL SUPPLY — 29 items
BAG COUNTER SPONGE SURGICOUNT (BAG) IMPLANT
BLADE SURG 21 STRL SS (BLADE) ×1 IMPLANT
BNDG COHESIVE 6X5 TAN ST LF (GAUZE/BANDAGES/DRESSINGS) IMPLANT
BNDG GAUZE DERMACEA FLUFF 4 (GAUZE/BANDAGES/DRESSINGS) ×2 IMPLANT
COVER SURGICAL LIGHT HANDLE (MISCELLANEOUS) ×2 IMPLANT
DRAPE U-SHAPE 47X51 STRL (DRAPES) ×1 IMPLANT
DRSG ADAPTIC 3X8 NADH LF (GAUZE/BANDAGES/DRESSINGS) ×1 IMPLANT
DURAPREP 26ML APPLICATOR (WOUND CARE) ×1 IMPLANT
ELECT REM PT RETURN 9FT ADLT (ELECTROSURGICAL)
ELECTRODE REM PT RTRN 9FT ADLT (ELECTROSURGICAL) IMPLANT
GAUZE PAD ABD 8X10 STRL (GAUZE/BANDAGES/DRESSINGS) IMPLANT
GAUZE SPONGE 4X4 12PLY STRL (GAUZE/BANDAGES/DRESSINGS) ×1 IMPLANT
GLOVE BIOGEL PI IND STRL 9 (GLOVE) ×1 IMPLANT
GLOVE SURG ORTHO 9.0 STRL STRW (GLOVE) ×1 IMPLANT
GOWN STRL REUS W/ TWL XL LVL3 (GOWN DISPOSABLE) ×2 IMPLANT
KIT BASIN OR (CUSTOM PROCEDURE TRAY) ×1 IMPLANT
KIT TURNOVER KIT B (KITS) ×1 IMPLANT
MANIFOLD NEPTUNE II (INSTRUMENTS) ×1 IMPLANT
NS IRRIG 1000ML POUR BTL (IV SOLUTION) ×1 IMPLANT
PACK ORTHO EXTREMITY (CUSTOM PROCEDURE TRAY) ×1 IMPLANT
PAD ARMBOARD 7.5X6 YLW CONV (MISCELLANEOUS) ×2 IMPLANT
SET HNDPC FAN SPRY TIP SCT (DISPOSABLE) IMPLANT
STOCKINETTE IMPERVIOUS 9X36 MD (GAUZE/BANDAGES/DRESSINGS) IMPLANT
SUT ETHILON 2 0 PSLX (SUTURE) ×1 IMPLANT
SWAB COLLECTION DEVICE MRSA (MISCELLANEOUS) ×1 IMPLANT
SWAB CULTURE ESWAB REG 1ML (MISCELLANEOUS) IMPLANT
TOWEL GREEN STERILE (TOWEL DISPOSABLE) ×1 IMPLANT
TUBE CONNECTING 12X1/4 (SUCTIONS) ×1 IMPLANT
YANKAUER SUCT BULB TIP NO VENT (SUCTIONS) ×1 IMPLANT

## 2023-08-24 NOTE — Transfer of Care (Signed)
Immediate Anesthesia Transfer of Care Note  Patient: Ellenie Salome Isaza  Procedure(s) Performed: IRRIGATION AND DEBRIDEMENT LEFT ACHILLES (Left)  Patient Location: PACU  Anesthesia Type:General  Level of Consciousness: drowsy  Airway & Oxygen Therapy: Patient Spontanous Breathing and Patient connected to face mask oxygen  Post-op Assessment: Report given to RN and Post -op Vital signs reviewed and stable  Post vital signs: Reviewed and stable  Last Vitals:  Vitals Value Taken Time  BP 119/79 08/24/23 1021  Temp 36.7 C 08/24/23 1021  Pulse 77 08/24/23 1023  Resp 11 08/24/23 1023  SpO2 100 % 08/24/23 1023  Vitals shown include unfiled device data.  Last Pain:  Vitals:   08/24/23 0641  TempSrc:   PainSc: 6          Complications: No notable events documented.

## 2023-08-24 NOTE — Progress Notes (Signed)
Orthopedic Tech Progress Note Patient Details:  Stacey Stewart 01-Sep-1965 [id number]  Ortho Devices Type of Ortho Device: Crutches, CAM walker Ortho Device/Splint Location: Left foot Ortho Device/Splint Interventions: Ordered, Application, Adjustment   Post Interventions Patient Tolerated: Well Instructions Provided: Adjustment of device, Poper ambulation with device, Care of device  Tonye Pearson 08/24/2023, 11:40 AM

## 2023-08-24 NOTE — Op Note (Signed)
08/24/2023  10:20 AM  PATIENT:  [name]    PRE-OPERATIVE DIAGNOSIS:  Dehiscence Left Achilles reconstruction  POST-OPERATIVE DIAGNOSIS:  Same  PROCEDURE: Excisional DEBRIDEMENT LEFT ACHILLES, removal of retained infected Vicryl suture. Tissue sent for cultures. Application of vancomycin powder 1 g. Local tissue rearrangement for wound closure 6 x 3 cm.  SURGEON:  [name], MD  PHYSICIAN ASSISTANT:None ANESTHESIA:   General  PREOPERATIVE INDICATIONS:  Stacey Stewart is a  58 y.o. female with a diagnosis of Dehiscence Left Achilles Incision who failed conservative measures and elected for surgical management.    The risks benefits and alternatives were discussed with the patient preoperatively including but not limited to the risks of infection, bleeding, nerve injury, cardiopulmonary complications, the need for revision surgery, among others, and the patient was willing to proceed.  OPERATIVE IMPLANTS:   * No implants in log *  @ENCIMAGES @  OPERATIVE FINDINGS: Patient had necrotic Achilles tendon surrounding the areas of the Vicryl suture knot with infection originating from the suture.  The suture and soft tissue was sent for cultures the remaining tissue was healthy and viable.  OPERATIVE PROCEDURE: Patient is brought the operating room and underwent a general anesthetic.  After adequate levels anesthesia were obtained patient's left lower extremity was prepped using DuraPrep draped into a sterile field a timeout was called.  Elliptical incision was made over the posterior incision that encompassed the ulcerative tissue.  This left a wound that was 6 x 3 cm.  There was infected retained Vicryl suture knots and surrounding necrotic Achilles tendon.  This was excised with a rondure and a 21 blade knife including excision of skin soft tissue tendon and retained absorbable suture.  Remaining soft tissue was healthy and viable.  The wound was irrigated with Vashe.  The wound  bed was filled with 1 g of vancomycin powder.  The infected suture and soft tissue was sent for cultures.  The tissue edges were undermined and local tissue transfer was used to close the wound 3 x 6 cm.  A sterile dressing was applied patient was extubated taken the PACU in stable condition.   DISCHARGE PLANNING:  Antibiotic duration: Preoperative antibiotics and intraoperative 1 g vancomycin powder  Weightbearing: Touchdown weightbearing on the left  Pain medication: Vicodin  Dressing care/ Wound VAC: Dry dressing  Ambulatory devices: Walker or crutches  Discharge to: Home.  Follow-up: In the office 1 week post operative.
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
## 2023-08-24 NOTE — Anesthesia Postprocedure Evaluation (Signed)
Anesthesia Post Note  Patient: Stacey Stewart  Procedure(s) Performed: IRRIGATION AND DEBRIDEMENT LEFT ACHILLES (Left)     Patient location during evaluation: PACU Anesthesia Type: General Level of consciousness: awake Pain management: pain level controlled Vital Signs Assessment: post-procedure vital signs reviewed and stable Respiratory status: spontaneous breathing, nonlabored ventilation and respiratory function stable Cardiovascular status: blood pressure returned to baseline and stable Postop Assessment: no apparent nausea or vomiting Anesthetic complications: no   No notable events documented.  Last Vitals:  Vitals:   08/24/23 1115 08/24/23 1130  BP: 127/86 125/74  Pulse: 79 86  Resp: 13 18  Temp:    SpO2: 97% 99%    Last Pain:  Vitals:   08/24/23 1100  TempSrc:   PainSc: 5                  Linton Rump

## 2023-08-24 NOTE — Anesthesia Procedure Notes (Signed)
Procedure Name: LMA Insertion Date/Time: 08/24/2023 9:48 AM  Performed by: Georgianne Fick D, CRNAPre-anesthesia Checklist: Patient identified, Emergency Drugs available, Suction available and Patient being monitored Patient Re-evaluated:Patient Re-evaluated prior to induction Oxygen Delivery Method: Circle System Utilized Preoxygenation: Pre-oxygenation with 100% oxygen Induction Type: IV induction Ventilation: Mask ventilation without difficulty LMA: LMA inserted LMA Size: 4.0 Number of attempts: 1 Airway Equipment and Method: Bite block Placement Confirmation: positive ETCO2 Tube secured with: Tape Dental Injury: Teeth and Oropharynx as per pre-operative assessment

## 2023-08-25 ENCOUNTER — Encounter (HOSPITAL_COMMUNITY): Payer: Self-pay | Admitting: Orthopedic Surgery

## 2023-08-27 ENCOUNTER — Telehealth: Payer: Self-pay | Admitting: Orthopedic Surgery

## 2023-08-27 NOTE — Telephone Encounter (Signed)
 Requested Prescriptions   Pending Prescriptions Disp Refills   . Januvia 100 MG Tab [Pharmacy Med Name: Januvia 100 mg tablet] 90 Tablet 2     Sig: TAKE ONE TABLET BY MOUTH DAILY       Antihyperglycemics Protocol Passed - 08/27/2023  1:11 PM        Passed - Serum Creatinine done  in past 12 months     Creatinine   Date Value Ref Range Status   10/20/2022 0.96 0.50 - 1.30 mg/dL Final     Creatinine Random UR   Date Value Ref Range Status   05/09/2022 43.6 mg/dL Final             Passed - Visit in past 6 months or future 90 days     Recent Visits  Date Type Provider Dept   04/16/23 Office Visit [facility], Anastasio Auerbach, DO [facility]   10/20/22 Office Visit [facility], Anastasio Auerbach, DO [facility]   Showing recent visits within past 365 days and meeting all other requirements  Future Appointments  Date Type Provider Dept   09/17/23 Appointment [facility], Anastasio Auerbach, DO [facility]   Showing future appointments within next 90 days and meeting all other requirements       Cholesterol   Date Value Ref Range Status   10/20/2022 147 112 - 199 mg/dL Final     Cholesterol/HDL Ratio   Date Value Ref Range Status   12/25/2011 4.7 (H) 0.0 - 4.5 Final     Triglycerides   Date Value Ref Range Status   10/20/2022 81 mg/dL Final     Comment:     REFERENCE RANGE:  < 150 mg/dl FOR FASTING  < 161 mg/dl FOR NON-FASTING  (BASED ON LITERATURE)       HDL Cholesterol   Date Value Ref Range Status   10/20/2022 56 40 - 100 mg/dL Final     Comment:     HDL REFERENCE RANGES    Low:       <40 mg/dL  Normal:    09-60 mg/dL  Desirable: >45 mg/dL                     Passed - No active pregnancy on record        Passed - No pregnancy test in the past 12 months or most recent test was negative        Passed - A1C done in past 12 months     No components found for: "HGBA1C"                Date of last refill: 12/05/22

## 2023-08-27 NOTE — Telephone Encounter (Signed)
 Letter printed. Will let them know ready for pick up at front desk.

## 2023-08-27 NOTE — Telephone Encounter (Signed)
 Pt daughter is requesting a work note for pt stating how long she will be out of work. Best call back number to confirm dates. [phone]

## 2023-08-29 LAB — AEROBIC/ANAEROBIC CULTURE W GRAM STAIN (SURGICAL/DEEP WOUND)
Culture: NO GROWTH
Gram Stain: NONE SEEN

## 2023-08-29 NOTE — Telephone Encounter (Signed)
 We just got referral and is it with provider for review. I did not call patient

## 2023-08-29 NOTE — Telephone Encounter (Signed)
 Attempt 1, Unable to reach patient, Okay for non clinical staff to relay Victoria Sweeney's message.

## 2023-08-29 NOTE — Telephone Encounter (Signed)
 Please see patient message.

## 2023-08-29 NOTE — Telephone Encounter (Signed)
 Pt says, she just a got a message about scheduling from referral, but it was for a Okey Regal.    She is checking to see if wrong number, wrong person, or what? (She does have a referral in)      Pt [phone]

## 2023-08-30 ENCOUNTER — Encounter: Payer: Self-pay | Admitting: Orthopedic Surgery

## 2023-08-30 ENCOUNTER — Ambulatory Visit: Payer: Medicaid Other | Admitting: Orthopedic Surgery

## 2023-08-30 DIAGNOSIS — S86012A Strain of left Achilles tendon, initial encounter: Secondary | ICD-10-CM
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
## 2023-08-31 NOTE — Telephone Encounter (Addendum)
 Called patient to let her know the referral is not ready to schedule.  We also wanted to inform her as to why we were referring to her as Victoria Sweeney, unfortunately a patient had the the same number in chart, we have fixed that mistake.   Patient did not answer. Left voicemail.

## 2023-08-31 NOTE — Telephone Encounter (Signed)
 Pt called back and again, is stating when we call her we are calling her Victoria Sweeney does not know who Okey Regal is.  She ia asking if she is ready to be scheduled?    Pt [phone]

## 2023-08-31 NOTE — Telephone Encounter (Signed)
 Pt called back and I relayed the above message, they understood and didn't have any additional questions at this time.

## 2023-09-04 NOTE — Telephone Encounter (Signed)
 Noted by PSR.

## 2023-09-13 ENCOUNTER — Encounter: Payer: Self-pay | Admitting: Family

## 2023-09-13 ENCOUNTER — Ambulatory Visit: Payer: Medicaid Other | Admitting: Family

## 2023-09-13 DIAGNOSIS — M65072 Abscess of tendon sheath, left ankle and foot: Secondary | ICD-10-CM
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
## 2023-11-27 ENCOUNTER — Other Ambulatory Visit (HOSPITAL_BASED_OUTPATIENT_CLINIC_OR_DEPARTMENT_OTHER): Payer: Self-pay | Admitting: Nurse Practitioner

## 2023-11-27 ENCOUNTER — Ambulatory Visit (HOSPITAL_BASED_OUTPATIENT_CLINIC_OR_DEPARTMENT_OTHER)
Admission: RE | Admit: 2023-11-27 | Discharge: 2023-11-27 | Disposition: A | Discharge status: Home / Self Care | Source: Ambulatory Visit | Attending: Nurse Practitioner | Admitting: Nurse Practitioner

## 2023-11-27 DIAGNOSIS — M25561 Pain in right knee: Secondary | ICD-10-CM | POA: Diagnosis not present

## 2023-11-27 DIAGNOSIS — M25551 Pain in right hip: Secondary | ICD-10-CM | POA: Diagnosis not present

## 2024-01-29 ENCOUNTER — Ambulatory Visit (INDEPENDENT_AMBULATORY_CARE_PROVIDER_SITE_OTHER)
Admission: RE | Admit: 2024-01-29 | Discharge: 2024-01-29 | Disposition: A | Discharge status: Home / Self Care | Source: Ambulatory Visit | Attending: Nurse Practitioner | Admitting: Nurse Practitioner

## 2024-01-29 ENCOUNTER — Other Ambulatory Visit (HOSPITAL_BASED_OUTPATIENT_CLINIC_OR_DEPARTMENT_OTHER): Payer: Self-pay | Admitting: Nurse Practitioner

## 2024-01-29 DIAGNOSIS — M898X8 Other specified disorders of bone, other site: Secondary | ICD-10-CM

## 2024-05-05 ENCOUNTER — Encounter: Payer: Self-pay | Admitting: Radiology
